# Patient Record
Sex: Female | Born: 1945 | Race: Black or African American | Hispanic: No | Smoking: Former smoker
Health system: Southern US, Community
[De-identification: ages and names within clinical notes are randomized; demographics above are authoritative.]

## PROBLEM LIST (undated history)

## (undated) DIAGNOSIS — E119 Type 2 diabetes mellitus without complications: Secondary | ICD-10-CM

## (undated) DIAGNOSIS — C3490 Malignant neoplasm of unspecified part of unspecified bronchus or lung: Secondary | ICD-10-CM

## (undated) DIAGNOSIS — J449 Chronic obstructive pulmonary disease, unspecified: Secondary | ICD-10-CM

## (undated) HISTORY — DX: Malignant neoplasm of unspecified part of unspecified bronchus or lung: C34.90

## (undated) HISTORY — PX: PORTACATH PLACEMENT: SHX2246

---

## 2004-07-08 ENCOUNTER — Ambulatory Visit: Payer: Self-pay

## 2005-10-25 ENCOUNTER — Emergency Department: Payer: Self-pay | Admitting: Emergency Medicine

## 2005-10-25 ENCOUNTER — Other Ambulatory Visit: Payer: Self-pay

## 2005-11-17 ENCOUNTER — Ambulatory Visit: Payer: Self-pay | Admitting: Cardiovascular Disease

## 2005-11-20 ENCOUNTER — Ambulatory Visit: Payer: Self-pay | Admitting: Oncology

## 2005-11-20 LAB — COMPREHENSIVE METABOLIC PANEL
ALT: 22 U/L (ref 0–40)
Albumin: 3.6 g/dL (ref 3.5–5.2)
CO2: 37 mEq/L — ABNORMAL HIGH (ref 19–32)
Potassium: 4.1 mEq/L (ref 3.5–5.3)
Sodium: 144 mEq/L (ref 135–145)
Total Bilirubin: 0.9 mg/dL (ref 0.3–1.2)
Total Protein: 6.6 g/dL (ref 6.0–8.3)

## 2005-11-20 LAB — CBC WITH DIFFERENTIAL (CANCER CENTER ONLY)
BASO#: 0 10*3/uL (ref 0.0–0.2)
Eosinophils Absolute: 0.1 10*3/uL (ref 0.0–0.5)
LYMPH%: 27.1 % (ref 14.0–48.0)
MCH: 27.4 pg (ref 26.0–34.0)
MCV: 86 fL (ref 81–101)
MONO%: 6.2 % (ref 0.0–13.0)
NEUT%: 63.3 % (ref 39.6–80.0)
Platelets: 126 10*3/uL — ABNORMAL LOW (ref 145–400)
RBC: 4.73 10*6/uL (ref 3.70–5.32)

## 2005-11-20 LAB — LACTATE DEHYDROGENASE: LDH: 206 U/L (ref 94–250)

## 2005-11-20 LAB — CEA: CEA: 2.9 ng/mL (ref 0.0–5.0)

## 2005-11-27 ENCOUNTER — Ambulatory Visit (HOSPITAL_COMMUNITY): Admission: RE | Admit: 2005-11-27 | Discharge: 2005-11-27 | Payer: Self-pay | Admitting: Oncology

## 2005-12-11 LAB — CBC WITH DIFFERENTIAL (CANCER CENTER ONLY)
BASO#: 0 10*3/uL (ref 0.0–0.2)
Eosinophils Absolute: 0.1 10*3/uL (ref 0.0–0.5)
HGB: 13.2 g/dL (ref 11.6–15.9)
MCH: 28.3 pg (ref 26.0–34.0)
MCV: 87 fL (ref 81–101)
MONO%: 8.6 % (ref 0.0–13.0)
NEUT#: 2.4 10*3/uL (ref 1.5–6.5)
RBC: 4.66 10*6/uL (ref 3.70–5.32)

## 2005-12-24 ENCOUNTER — Ambulatory Visit: Payer: Self-pay | Admitting: Internal Medicine

## 2005-12-25 ENCOUNTER — Ambulatory Visit: Payer: Self-pay | Admitting: Oncology

## 2006-01-15 ENCOUNTER — Ambulatory Visit: Payer: Self-pay | Admitting: Gastroenterology

## 2006-01-21 ENCOUNTER — Ambulatory Visit: Payer: Self-pay | Admitting: Internal Medicine

## 2006-02-16 ENCOUNTER — Ambulatory Visit: Payer: Self-pay | Admitting: Gastroenterology

## 2006-03-11 ENCOUNTER — Ambulatory Visit: Payer: Self-pay | Admitting: Gastroenterology

## 2006-04-08 ENCOUNTER — Ambulatory Visit: Payer: Self-pay | Admitting: Oncology

## 2006-04-09 LAB — CBC WITH DIFFERENTIAL (CANCER CENTER ONLY)
BASO%: 0.4 % (ref 0.0–2.0)
EOS%: 2.2 % (ref 0.0–7.0)
LYMPH%: 27.9 % (ref 14.0–48.0)
MCH: 28.3 pg (ref 26.0–34.0)
MCHC: 32.4 g/dL (ref 32.0–36.0)
MCV: 87 fL (ref 81–101)
MONO%: 8.1 % (ref 0.0–13.0)
Platelets: 159 10*3/uL (ref 145–400)
RDW: 12.4 % (ref 10.5–14.6)
WBC: 4.2 10*3/uL (ref 3.9–10.0)

## 2006-04-09 LAB — BASIC METABOLIC PANEL
CO2: 32 mEq/L (ref 19–32)
Calcium: 9.4 mg/dL (ref 8.4–10.5)
Creatinine, Ser: 1.03 mg/dL (ref 0.40–1.20)
Glucose, Bld: 127 mg/dL — ABNORMAL HIGH (ref 70–99)

## 2006-06-11 ENCOUNTER — Ambulatory Visit: Payer: Self-pay | Admitting: Gastroenterology

## 2006-12-24 ENCOUNTER — Ambulatory Visit: Payer: Self-pay | Admitting: Gastroenterology

## 2006-12-29 ENCOUNTER — Ambulatory Visit: Payer: Self-pay | Admitting: Family Medicine

## 2007-03-29 ENCOUNTER — Ambulatory Visit: Payer: Self-pay | Admitting: Oncology

## 2007-04-06 LAB — CBC WITH DIFFERENTIAL (CANCER CENTER ONLY)
BASO#: 0 10*3/uL (ref 0.0–0.2)
EOS%: 2.6 % (ref 0.0–7.0)
Eosinophils Absolute: 0.1 10*3/uL (ref 0.0–0.5)
HGB: 11.8 g/dL (ref 11.6–15.9)
LYMPH#: 1 10*3/uL (ref 0.9–3.3)
MONO#: 0.3 10*3/uL (ref 0.1–0.9)
NEUT#: 2.3 10*3/uL (ref 1.5–6.5)
RBC: 4.26 10*6/uL (ref 3.70–5.32)
WBC: 3.8 10*3/uL — ABNORMAL LOW (ref 3.9–10.0)

## 2007-04-06 LAB — COMPREHENSIVE METABOLIC PANEL
Albumin: 4.1 g/dL (ref 3.5–5.2)
Alkaline Phosphatase: 74 U/L (ref 39–117)
BUN: 17 mg/dL (ref 6–23)
Creatinine, Ser: 0.91 mg/dL (ref 0.40–1.20)
Glucose, Bld: 153 mg/dL — ABNORMAL HIGH (ref 70–99)
Potassium: 4.1 mEq/L (ref 3.5–5.3)
Total Bilirubin: 0.6 mg/dL (ref 0.3–1.2)

## 2007-05-02 ENCOUNTER — Emergency Department: Payer: Self-pay | Admitting: Emergency Medicine

## 2007-05-02 ENCOUNTER — Other Ambulatory Visit: Payer: Self-pay

## 2007-07-01 ENCOUNTER — Ambulatory Visit: Payer: Self-pay | Admitting: Gastroenterology

## 2007-08-05 ENCOUNTER — Ambulatory Visit: Payer: Self-pay | Admitting: Gastroenterology

## 2007-10-11 ENCOUNTER — Ambulatory Visit: Payer: Self-pay | Admitting: Oncology

## 2007-10-13 LAB — CBC WITH DIFFERENTIAL (CANCER CENTER ONLY)
BASO%: 0.3 % (ref 0.0–2.0)
EOS%: 2.4 % (ref 0.0–7.0)
HGB: 11.8 g/dL (ref 11.6–15.9)
LYMPH#: 1 10*3/uL (ref 0.9–3.3)
MCHC: 32.8 g/dL (ref 32.0–36.0)
NEUT#: 2.3 10*3/uL (ref 1.5–6.5)
RDW: 12.4 % (ref 10.5–14.6)

## 2007-10-13 LAB — COMPREHENSIVE METABOLIC PANEL
ALT: 18 U/L (ref 0–35)
AST: 17 U/L (ref 0–37)
Albumin: 4 g/dL (ref 3.5–5.2)
Alkaline Phosphatase: 81 U/L (ref 39–117)
Potassium: 4 mEq/L (ref 3.5–5.3)
Sodium: 142 mEq/L (ref 135–145)
Total Bilirubin: 0.6 mg/dL (ref 0.3–1.2)
Total Protein: 7 g/dL (ref 6.0–8.3)

## 2008-07-31 ENCOUNTER — Emergency Department: Payer: Self-pay | Admitting: Unknown Physician Specialty

## 2008-08-29 ENCOUNTER — Ambulatory Visit: Payer: Self-pay | Admitting: Internal Medicine

## 2009-01-17 ENCOUNTER — Ambulatory Visit: Payer: Self-pay | Admitting: Family Medicine

## 2009-07-28 ENCOUNTER — Emergency Department: Payer: Self-pay | Admitting: Emergency Medicine

## 2009-08-14 ENCOUNTER — Ambulatory Visit: Payer: Self-pay | Admitting: Specialist

## 2010-04-03 ENCOUNTER — Ambulatory Visit: Payer: Self-pay | Admitting: Internal Medicine

## 2011-07-08 ENCOUNTER — Ambulatory Visit: Payer: Self-pay | Admitting: Internal Medicine

## 2012-06-15 ENCOUNTER — Emergency Department: Payer: Self-pay | Admitting: Emergency Medicine

## 2012-06-15 LAB — CBC WITH DIFFERENTIAL/PLATELET
Basophil %: 0.8 %
HCT: 36.7 % (ref 35.0–47.0)
MCHC: 32.5 g/dL (ref 32.0–36.0)
MCV: 84 fL (ref 80–100)
Monocyte #: 0.2 x10 3/mm (ref 0.2–0.9)
Monocyte %: 3.4 %
Platelet: 182 10*3/uL (ref 150–440)
WBC: 6.2 10*3/uL (ref 3.6–11.0)

## 2012-06-15 LAB — TROPONIN I: Troponin-I: <0.02 ng/mL

## 2012-06-15 LAB — BASIC METABOLIC PANEL
BUN: 34 mg/dL — ABNORMAL HIGH (ref 7–18)
Calcium, Total: 9.8 mg/dL (ref 8.5–10.1)
Chloride: 103 mmol/L (ref 98–107)
Co2: 34 mmol/L — ABNORMAL HIGH (ref 21–32)
EGFR (African American): 42 — ABNORMAL LOW
Osmolality: 296 (ref 275–301)
Sodium: 141 mmol/L (ref 136–145)

## 2012-06-15 LAB — URINALYSIS, COMPLETE
Bacteria: NONE SEEN
Blood: NEGATIVE
Glucose,UR: NEGATIVE mg/dL (ref 0–75)
Ketone: NEGATIVE
Ph: 5 (ref 4.5–8.0)
Protein: NEGATIVE

## 2012-06-15 LAB — CK TOTAL AND CKMB (NOT AT ARMC): CK-MB: 1.6 ng/mL (ref 0.5–3.6)

## 2012-08-18 ENCOUNTER — Ambulatory Visit: Payer: Self-pay | Admitting: Internal Medicine

## 2013-01-19 ENCOUNTER — Ambulatory Visit: Payer: Self-pay | Admitting: Ophthalmology

## 2013-01-31 ENCOUNTER — Ambulatory Visit: Payer: Self-pay | Admitting: Ophthalmology

## 2013-03-31 ENCOUNTER — Ambulatory Visit: Payer: Self-pay | Admitting: Ophthalmology

## 2013-03-31 LAB — POTASSIUM: POTASSIUM: 4.3 mmol/L (ref 3.5–5.1)

## 2013-04-12 ENCOUNTER — Ambulatory Visit: Payer: Self-pay | Admitting: Ophthalmology

## 2013-09-23 DIAGNOSIS — G4733 Obstructive sleep apnea (adult) (pediatric): Secondary | ICD-10-CM | POA: Insufficient documentation

## 2013-09-23 DIAGNOSIS — J449 Chronic obstructive pulmonary disease, unspecified: Secondary | ICD-10-CM | POA: Insufficient documentation

## 2013-10-21 ENCOUNTER — Ambulatory Visit: Payer: Self-pay | Admitting: Internal Medicine

## 2013-10-26 ENCOUNTER — Ambulatory Visit: Payer: Self-pay | Admitting: Internal Medicine

## 2013-12-23 ENCOUNTER — Ambulatory Visit: Payer: Self-pay | Admitting: Internal Medicine

## 2014-01-02 ENCOUNTER — Ambulatory Visit: Payer: Self-pay | Admitting: Internal Medicine

## 2014-01-06 ENCOUNTER — Ambulatory Visit: Payer: Self-pay | Admitting: Internal Medicine

## 2014-01-09 LAB — PROTEIN ELECTROPHORESIS(ARMC)

## 2014-01-09 LAB — UR PROT ELECTROPHORESIS, URINE RANDOM

## 2014-01-13 ENCOUNTER — Ambulatory Visit: Payer: Self-pay | Admitting: Internal Medicine

## 2014-01-13 LAB — CBC CANCER CENTER
Basophil #: 0 "x10 3/mm "
Basophil %: 0.4 %
Eosinophil #: 0.1 "x10 3/mm "
Eosinophil %: 1.6 %
HCT: 36.6 %
HGB: 11.6 g/dL — ABNORMAL LOW
Lymphocyte %: 16.2 %
Lymphs Abs: 0.8 "x10 3/mm " — ABNORMAL LOW
MCH: 27.3 pg
MCHC: 31.7 g/dL — ABNORMAL LOW
MCV: 86 fL
Monocyte #: 0.4 "x10 3/mm "
Monocyte %: 9.6 %
Neutrophil #: 3.4 "x10 3/mm "
Neutrophil %: 72.2 %
Platelet: 203 "x10 3/mm "
RBC: 4.26 "x10 6/mm "
RDW: 14.1 %
WBC: 4.7 "x10 3/mm "

## 2014-01-13 LAB — COMPREHENSIVE METABOLIC PANEL
ALBUMIN: 3.8 g/dL (ref 3.4–5.0)
AST: 28 U/L (ref 15–37)
Alkaline Phosphatase: 124 U/L — ABNORMAL HIGH
Anion Gap: 8 (ref 7–16)
BUN: 28 mg/dL — AB (ref 7–18)
Bilirubin,Total: 0.7 mg/dL (ref 0.2–1.0)
CALCIUM: 9.8 mg/dL (ref 8.5–10.1)
CHLORIDE: 101 mmol/L (ref 98–107)
CO2: 36 mmol/L — AB (ref 21–32)
CREATININE: 1.59 mg/dL — AB (ref 0.60–1.30)
EGFR (African American): 42 — ABNORMAL LOW
GFR CALC NON AF AMER: 34 — AB
Glucose: 128 mg/dL — ABNORMAL HIGH (ref 65–99)
Osmolality: 296 (ref 275–301)
Potassium: 4.3 mmol/L (ref 3.5–5.1)
SGPT (ALT): 28 U/L
SODIUM: 145 mmol/L (ref 136–145)
Total Protein: 7.9 g/dL (ref 6.4–8.2)

## 2014-01-13 LAB — PROTIME-INR
INR: 1.1
Prothrombin Time: 13.6 secs (ref 11.5–14.7)

## 2014-01-13 LAB — APTT: ACTIVATED PTT: 28.9 s (ref 23.6–35.9)

## 2014-01-16 ENCOUNTER — Ambulatory Visit: Payer: Self-pay | Admitting: Gastroenterology

## 2014-01-31 ENCOUNTER — Ambulatory Visit: Payer: Self-pay | Admitting: Internal Medicine

## 2014-02-06 ENCOUNTER — Ambulatory Visit: Payer: Self-pay | Admitting: Internal Medicine

## 2014-02-10 ENCOUNTER — Ambulatory Visit: Payer: Self-pay | Admitting: Internal Medicine

## 2014-02-17 LAB — CREATININE, SERUM
Creatinine: 1.41 mg/dL — ABNORMAL HIGH (ref 0.60–1.30)
EGFR (Non-African Amer.): 39 — ABNORMAL LOW
GFR CALC AF AMER: 48 — AB

## 2014-02-17 LAB — CALCIUM: Calcium, Total: 9.1 mg/dL (ref 8.5–10.1)

## 2014-02-26 ENCOUNTER — Emergency Department: Payer: Self-pay | Admitting: Emergency Medicine

## 2014-02-26 LAB — CBC WITH DIFFERENTIAL/PLATELET
Basophil #: 0 10*3/uL (ref 0.0–0.1)
Basophil %: 0.2 %
Eosinophil #: 0.1 10*3/uL (ref 0.0–0.7)
Eosinophil %: 2.2 %
HCT: 33.3 % — AB (ref 35.0–47.0)
HGB: 10.7 g/dL — ABNORMAL LOW (ref 12.0–16.0)
LYMPHS ABS: 0.2 10*3/uL — AB (ref 1.0–3.6)
Lymphocyte %: 4 %
MCH: 28.2 pg (ref 26.0–34.0)
MCHC: 32.1 g/dL (ref 32.0–36.0)
MCV: 88 fL (ref 80–100)
Monocyte #: 0.3 x10 3/mm (ref 0.2–0.9)
Monocyte %: 6.4 %
NEUTROS ABS: 4.2 10*3/uL (ref 1.4–6.5)
Neutrophil %: 87.2 %
PLATELETS: 133 10*3/uL — AB (ref 150–440)
RBC: 3.8 10*6/uL (ref 3.80–5.20)
RDW: 14.2 % (ref 11.5–14.5)
WBC: 4.9 10*3/uL (ref 3.6–11.0)

## 2014-02-26 LAB — COMPREHENSIVE METABOLIC PANEL
ALK PHOS: 94 U/L
ALT: 23 U/L
AST: 29 U/L (ref 15–37)
Albumin: 3.5 g/dL (ref 3.4–5.0)
Anion Gap: 7 (ref 7–16)
BUN: 20 mg/dL — AB (ref 7–18)
Bilirubin,Total: 0.4 mg/dL (ref 0.2–1.0)
CHLORIDE: 109 mmol/L — AB (ref 98–107)
CO2: 28 mmol/L (ref 21–32)
Calcium, Total: 7.3 mg/dL — ABNORMAL LOW (ref 8.5–10.1)
Creatinine: 0.92 mg/dL (ref 0.60–1.30)
Glucose: 133 mg/dL — ABNORMAL HIGH (ref 65–99)
Osmolality: 291 (ref 275–301)
Potassium: 4.9 mmol/L (ref 3.5–5.1)
Sodium: 144 mmol/L (ref 136–145)
Total Protein: 7.5 g/dL (ref 6.4–8.2)

## 2014-02-26 LAB — TROPONIN I

## 2014-02-26 LAB — LIPASE, BLOOD: LIPASE: 303 U/L (ref 73–393)

## 2014-03-13 ENCOUNTER — Ambulatory Visit: Payer: Self-pay | Admitting: Internal Medicine

## 2014-03-13 LAB — BASIC METABOLIC PANEL
Anion Gap: 8 (ref 7–16)
BUN: 20 mg/dL — AB (ref 7–18)
CHLORIDE: 108 mmol/L — AB (ref 98–107)
CREATININE: 1.08 mg/dL (ref 0.60–1.30)
Calcium, Total: 7.8 mg/dL — ABNORMAL LOW (ref 8.5–10.1)
Co2: 30 mmol/L (ref 21–32)
EGFR (African American): >60
GFR CALC NON AF AMER: 54 — AB
GLUCOSE: 171 mg/dL — AB (ref 65–99)
OSMOLALITY: 297 (ref 275–301)
POTASSIUM: 4.7 mmol/L (ref 3.5–5.1)
SODIUM: 146 mmol/L — AB (ref 136–145)

## 2014-03-13 LAB — HEPATIC FUNCTION PANEL A (ARMC)
ALBUMIN: 3.2 g/dL — AB (ref 3.4–5.0)
ALK PHOS: 79 U/L (ref 46–116)
ALT: 21 U/L (ref 14–63)
AST: 18 U/L (ref 15–37)
Bilirubin, Direct: 0.1 mg/dL (ref 0.0–0.2)
Bilirubin,Total: 0.5 mg/dL (ref 0.2–1.0)
TOTAL PROTEIN: 6.7 g/dL (ref 6.4–8.2)

## 2014-03-13 LAB — CBC CANCER CENTER
Basophil #: 0 x10 3/mm (ref 0.0–0.1)
Basophil %: 0.3 %
EOS PCT: 4.3 %
Eosinophil #: 0.2 x10 3/mm (ref 0.0–0.7)
HCT: 30.2 % — ABNORMAL LOW (ref 35.0–47.0)
HGB: 9.7 g/dL — ABNORMAL LOW (ref 12.0–16.0)
LYMPHS ABS: 0.1 x10 3/mm — AB (ref 1.0–3.6)
Lymphocyte %: 3.4 %
MCH: 27.6 pg (ref 26.0–34.0)
MCHC: 32 g/dL (ref 32.0–36.0)
MCV: 86 fL (ref 80–100)
MONOS PCT: 9 %
Monocyte #: 0.4 x10 3/mm (ref 0.2–0.9)
NEUTROS ABS: 3.5 x10 3/mm (ref 1.4–6.5)
Neutrophil %: 83 %
Platelet: 141 x10 3/mm — ABNORMAL LOW (ref 150–440)
RBC: 3.5 10*6/uL — ABNORMAL LOW (ref 3.80–5.20)
RDW: 14.5 % (ref 11.5–14.5)
WBC: 4.2 x10 3/mm (ref 3.6–11.0)

## 2014-03-17 LAB — BASIC METABOLIC PANEL
ANION GAP: 8 (ref 7–16)
BUN: 20 mg/dL — ABNORMAL HIGH (ref 7–18)
CALCIUM: 7.1 mg/dL — AB (ref 8.5–10.1)
Chloride: 107 mmol/L (ref 98–107)
Co2: 30 mmol/L (ref 21–32)
Creatinine: 1.05 mg/dL (ref 0.60–1.30)
EGFR (Non-African Amer.): 55 — ABNORMAL LOW
GLUCOSE: 42 mg/dL — AB (ref 65–99)
OSMOLALITY: 288 (ref 275–301)
Potassium: 5.2 mmol/L — ABNORMAL HIGH (ref 3.5–5.1)
Sodium: 145 mmol/L (ref 136–145)

## 2014-03-17 LAB — URINALYSIS, COMPLETE
BACTERIA: NONE SEEN
Bilirubin,UR: NEGATIVE
GLUCOSE, UR: NEGATIVE mg/dL (ref 0–75)
KETONE: NEGATIVE
Nitrite: NEGATIVE
Ph: 7 (ref 4.5–8.0)
Protein: NEGATIVE
SPECIFIC GRAVITY: 1.012 (ref 1.003–1.030)

## 2014-03-17 LAB — CBC CANCER CENTER
BASOS PCT: 0.2 %
Basophil #: 0 x10 3/mm (ref 0.0–0.1)
EOS PCT: 5.3 %
Eosinophil #: 0.2 x10 3/mm (ref 0.0–0.7)
HCT: 30.2 % — AB (ref 35.0–47.0)
HGB: 9.6 g/dL — AB (ref 12.0–16.0)
Lymphocyte #: 0.2 x10 3/mm — ABNORMAL LOW (ref 1.0–3.6)
Lymphocyte %: 4.5 %
MCH: 27.4 pg (ref 26.0–34.0)
MCHC: 31.9 g/dL — ABNORMAL LOW (ref 32.0–36.0)
MCV: 86 fL (ref 80–100)
MONO ABS: 0 x10 3/mm — AB (ref 0.2–0.9)
Monocyte %: 1.5 %
NEUTROS ABS: 3 x10 3/mm (ref 1.4–6.5)
NEUTROS PCT: 88.5 %
Platelet: 146 x10 3/mm — ABNORMAL LOW (ref 150–440)
RBC: 3.51 10*6/uL — AB (ref 3.80–5.20)
RDW: 14.3 % (ref 11.5–14.5)
WBC: 3.4 x10 3/mm — ABNORMAL LOW (ref 3.6–11.0)

## 2014-03-17 LAB — GLUCOSE, RANDOM: GLUCOSE: 122 mg/dL — AB (ref 65–99)

## 2014-03-19 LAB — URINE CULTURE

## 2014-04-04 ENCOUNTER — Ambulatory Visit: Payer: Self-pay | Admitting: Vascular Surgery

## 2014-04-11 ENCOUNTER — Ambulatory Visit
Admit: 2014-04-11 | Disposition: A | Discharge status: Home / Self Care | Payer: Self-pay | Attending: Internal Medicine | Admitting: Internal Medicine

## 2014-05-04 LAB — POTASSIUM: Potassium: 4.3 mmol/L

## 2014-05-05 LAB — POTASSIUM: POTASSIUM: 6.1 mmol/L — AB

## 2014-05-11 LAB — CBC CANCER CENTER
BASOS ABS: 0 x10 3/mm (ref 0.0–0.1)
BASOS PCT: 0.2 %
EOS ABS: 0 x10 3/mm (ref 0.0–0.7)
Eosinophil %: 0.8 %
HCT: 20.6 % — ABNORMAL LOW (ref 35.0–47.0)
HGB: 6.3 g/dL — AB (ref 12.0–16.0)
LYMPHS PCT: 4.5 %
Lymphocyte #: 0.2 x10 3/mm — ABNORMAL LOW (ref 1.0–3.6)
MCH: 27.4 pg (ref 26.0–34.0)
MCHC: 30.4 g/dL — AB (ref 32.0–36.0)
MCV: 90 fL (ref 80–100)
MONO ABS: 0.6 x10 3/mm (ref 0.2–0.9)
Monocyte %: 15.7 %
NEUTROS PCT: 78.8 %
Neutrophil #: 3 x10 3/mm (ref 1.4–6.5)
Platelet: 239 x10 3/mm (ref 150–440)
RBC: 2.29 10*6/uL — ABNORMAL LOW (ref 3.80–5.20)
RDW: 18.1 % — AB (ref 11.5–14.5)
WBC: 3.8 x10 3/mm (ref 3.6–11.0)

## 2014-05-11 LAB — HEPATIC FUNCTION PANEL A (ARMC)
ALBUMIN: 2.9 g/dL — AB
ALK PHOS: 58 U/L
ALT: 10 U/L — AB
BILIRUBIN TOTAL: 0.6 mg/dL
Bilirubin, Direct: <0.1 mg/dL
SGOT(AST): 21 U/L
TOTAL PROTEIN: 6.6 g/dL

## 2014-05-11 LAB — BASIC METABOLIC PANEL
ANION GAP: 6 — AB (ref 7–16)
BUN: 35 mg/dL — AB
CHLORIDE: 105 mmol/L
CO2: 28 mmol/L
Calcium, Total: 7.5 mg/dL — ABNORMAL LOW
Creatinine: 1.32 mg/dL — ABNORMAL HIGH
GFR CALC AF AMER: 48 — AB
GFR CALC NON AF AMER: 41 — AB
Glucose: 119 mg/dL — ABNORMAL HIGH
Potassium: 4.3 mmol/L
Sodium: 139 mmol/L

## 2014-05-12 ENCOUNTER — Ambulatory Visit
Admit: 2014-05-12 | Disposition: A | Discharge status: Home / Self Care | Payer: Self-pay | Attending: Internal Medicine | Admitting: Internal Medicine

## 2014-05-16 LAB — CBC CANCER CENTER
BASOS PCT: 0.3 %
Basophil #: 0 x10 3/mm (ref 0.0–0.1)
Eosinophil #: 0 x10 3/mm (ref 0.0–0.7)
Eosinophil %: 0.6 %
HCT: 25.1 % — ABNORMAL LOW (ref 35.0–47.0)
HGB: 8.2 g/dL — AB (ref 12.0–16.0)
LYMPHS PCT: 5.5 %
Lymphocyte #: 0.2 x10 3/mm — ABNORMAL LOW (ref 1.0–3.6)
MCH: 28.6 pg (ref 26.0–34.0)
MCHC: 32.8 g/dL (ref 32.0–36.0)
MCV: 87 fL (ref 80–100)
MONOS PCT: 15.4 %
Monocyte #: 0.6 x10 3/mm (ref 0.2–0.9)
NEUTROS PCT: 78.2 %
Neutrophil #: 3.3 x10 3/mm (ref 1.4–6.5)
PLATELETS: 224 x10 3/mm (ref 150–440)
RBC: 2.88 10*6/uL — AB (ref 3.80–5.20)
RDW: 18.8 % — ABNORMAL HIGH (ref 11.5–14.5)
WBC: 4.2 x10 3/mm (ref 3.6–11.0)

## 2014-05-16 LAB — BASIC METABOLIC PANEL
Anion Gap: 5 — ABNORMAL LOW (ref 7–16)
BUN: 16 mg/dL
CALCIUM: 8 mg/dL — AB
CHLORIDE: 106 mmol/L
CO2: 30 mmol/L
Creatinine: 0.85 mg/dL
EGFR (African American): >60
EGFR (Non-African Amer.): >60
Glucose: 123 mg/dL — ABNORMAL HIGH
Potassium: 4.4 mmol/L
SODIUM: 141 mmol/L

## 2014-05-23 LAB — CBC CANCER CENTER
BASOS PCT: 0.2 %
Basophil #: 0 x10 3/mm (ref 0.0–0.1)
EOS PCT: 0.5 %
Eosinophil #: 0 x10 3/mm (ref 0.0–0.7)
HCT: 22.8 % — ABNORMAL LOW (ref 35.0–47.0)
HGB: 7.3 g/dL — ABNORMAL LOW (ref 12.0–16.0)
Lymphocyte #: 0.1 x10 3/mm — ABNORMAL LOW (ref 1.0–3.6)
Lymphocyte %: 2.9 %
MCH: 27.6 pg (ref 26.0–34.0)
MCHC: 31.9 g/dL — ABNORMAL LOW (ref 32.0–36.0)
MCV: 87 fL (ref 80–100)
MONO ABS: 0.4 x10 3/mm (ref 0.2–0.9)
Monocyte %: 17.5 %
NEUTROS PCT: 78.9 %
Neutrophil #: 1.8 x10 3/mm (ref 1.4–6.5)
Platelet: 79 x10 3/mm — ABNORMAL LOW (ref 150–440)
RBC: 2.63 10*6/uL — AB (ref 3.80–5.20)
RDW: 17.9 % — ABNORMAL HIGH (ref 11.5–14.5)
WBC: 2.3 x10 3/mm — ABNORMAL LOW (ref 3.6–11.0)

## 2014-05-23 LAB — POTASSIUM: POTASSIUM: 4.7 mmol/L

## 2014-05-30 LAB — CBC CANCER CENTER
BASOS ABS: 0 x10 3/mm (ref 0.0–0.1)
Basophil %: 0.1 %
Eosinophil #: 0 x10 3/mm (ref 0.0–0.7)
Eosinophil %: 0.5 %
HCT: 19.5 % — ABNORMAL LOW (ref 35.0–47.0)
HGB: 6.1 g/dL — ABNORMAL LOW (ref 12.0–16.0)
LYMPHS PCT: 5.2 %
Lymphocyte #: 0.3 x10 3/mm — ABNORMAL LOW (ref 1.0–3.6)
MCH: 27.6 pg (ref 26.0–34.0)
MCHC: 31.3 g/dL — ABNORMAL LOW (ref 32.0–36.0)
MCV: 88 fL (ref 80–100)
MONOS PCT: 12.5 %
Monocyte #: 0.7 x10 3/mm (ref 0.2–0.9)
Neutrophil #: 4.6 x10 3/mm (ref 1.4–6.5)
Neutrophil %: 81.7 %
PLATELETS: 187 x10 3/mm (ref 150–440)
RBC: 2.21 10*6/uL — ABNORMAL LOW (ref 3.80–5.20)
RDW: 18.6 % — AB (ref 11.5–14.5)
WBC: 5.6 x10 3/mm (ref 3.6–11.0)

## 2014-05-30 LAB — POTASSIUM: Potassium: 4 mmol/L

## 2014-05-30 LAB — CREATININE, SERUM
Creatinine: 1.04 mg/dL — ABNORMAL HIGH
EGFR (African American): >60
EGFR (Non-African Amer.): 55 — ABNORMAL LOW

## 2014-05-30 LAB — HEPATIC FUNCTION PANEL A (ARMC)
ALK PHOS: 64 U/L
Albumin: 2.3 g/dL — ABNORMAL LOW
BILIRUBIN TOTAL: 0.5 mg/dL
SGOT(AST): 30 U/L
SGPT (ALT): 18 U/L
Total Protein: 6 g/dL — ABNORMAL LOW

## 2014-06-03 NOTE — Op Note (Signed)
PATIENT NAME:  Sharon Duran, Sharon Duran MR#:  [id number] DATE OF BIRTH:  [birth year]  DATE OF PROCEDURE:  04/12/2013  PREOPERATIVE DIAGNOSIS: Visually significant cataract of the left eye.   POSTOPERATIVE DIAGNOSIS: Visually significant cataract of the left eye.   OPERATIVE PROCEDURE: Cataract extraction by phacoemulsification with implant of intraocular lens to left eye.   SURGEON: Birder Robson, MD.   ANESTHESIA:  1. Managed anesthesia care.  2. Topical tetracaine drops followed by 2% Xylocaine jelly applied in the preoperative holding area.   COMPLICATIONS: None.   TECHNIQUE:  Stop and chop.  DESCRIPTION OF PROCEDURE: The patient was examined and consented in the preoperative holding area where the aforementioned topical anesthesia was applied to the left eye and then brought back to the Operating Room where the left eye was prepped and draped in the usual sterile ophthalmic fashion and a lid speculum was placed. A paracentesis was created with the side port blade and the anterior chamber was filled with viscoelastic. A near clear corneal incision was performed with the steel keratome. A continuous curvilinear capsulorrhexis was performed with a cystotome followed by the capsulorrhexis forceps. Hydrodissection and hydrodelineation were carried out with BSS on a blunt cannula. The lens was removed in a stop and chop technique and the remaining cortical material was removed with the irrigation-aspiration handpiece. The capsular bag was inflated with viscoelastic and the Tecnis ZCB00 24.5-diopter lens, serial number [id number] was placed in the capsular bag without complication. The remaining viscoelastic was removed from the eye with the irrigation-aspiration handpiece. The wounds were hydrated. The anterior chamber was flushed with Miostat and the eye was inflated to physiologic pressure. 0.1 mL of cefuroxime concentration 10 mg/mL was placed in the anterior chamber. The wounds were found to be water  tight. The eye was dressed with Vigamox. The patient was given protective glasses to wear throughout the day and a shield with which to sleep tonight. The patient was also given drops with which to begin a drop regimen today and will follow-up with me in one day.    ____________________________ Livingston Diones. Alani Lacivita, MD wlp:ea D: 04/12/2013 20:03:33 ET T: 04/13/2013 07:18:27 ET JOB#: [id number]  cc: Babe Anthis L. Argie Lober, MD, <Dictator> Livingston Diones Alyxis Grippi MD ELECTRONICALLY SIGNED 04/14/2013 9:39

## 2014-06-03 NOTE — Op Note (Signed)
PATIENT NAME:  Sharon Duran, Sharon Duran MR#:  [id number] DATE OF BIRTH:  02-09-1946  DATE OF PROCEDURE:  01/31/2013  PREOPERATIVE DIAGNOSIS: Visually significant cataract of the right eye.   POSTOPERATIVE DIAGNOSIS: Visually significant cataract of the right eye.   OPERATIVE PROCEDURE: Cataract extraction by phacoemulsification with implant of intraocular lens to the right eye.   SURGEON: Birder Robson, MD.   ANESTHESIA:  1. Managed anesthesia care.  2. Topical tetracaine drops followed by 2% Xylocaine jelly applied in the preoperative holding area.   COMPLICATIONS: None.   TECHNIQUE:  Stop and chop.   DESCRIPTION OF PROCEDURE: The patient was examined and consented in the preoperative holding area where the aforementioned topical anesthesia was applied to the right eye and then brought back to the Operating Room where the right eye was prepped and draped in the usual sterile ophthalmic fashion and a lid speculum was placed. A paracentesis was created with the side port blade and the anterior chamber was filled with viscoelastic. A near clear corneal incision was performed with the steel keratome. A continuous curvilinear capsulorrhexis was performed with a cystotome followed by the capsulorrhexis forceps. Hydrodissection and hydrodelineation were carried out with BSS on a blunt cannula. The lens was removed in a stop and chop technique and the remaining cortical material was removed with the irrigation-aspiration handpiece. The capsular bag was inflated with viscoelastic and the Tecnis ZCB00 23.5-diopter lens, serial number [id number] was placed in the capsular bag without complication. The remaining viscoelastic was removed from the eye with the irrigation-aspiration handpiece. The wounds were hydrated. The anterior chamber was flushed with Miostat and the eye was inflated to physiologic pressure. 0.1 mL of cefuroxime concentration 10 mg/mL was placed in the anterior chamber. The wounds were found to  be water tight. The eye was dressed with Vigamox. The patient was given protective glasses to wear throughout the day and a shield with which to sleep tonight. The patient was also given drops with which to begin a drop regimen today and will follow-up with me in one day.   ____________________________ Livingston Diones. Mikyla Schachter, MD wlp:gb D: 01/31/2013 17:00:40 ET T: 01/31/2013 23:08:53 ET JOB#: [id number]  cc: Gazelle Towe L. Leyli Kevorkian, MD, <Dictator> Livingston Diones Nilsa Macht MD ELECTRONICALLY SIGNED 02/17/2013 17:18

## 2014-06-05 LAB — SURGICAL PATHOLOGY

## 2014-06-06 LAB — CBC CANCER CENTER
BASOS PCT: 0.4 %
Basophil #: 0 x10 3/mm (ref 0.0–0.1)
EOS PCT: 0.6 %
Eosinophil #: 0 x10 3/mm (ref 0.0–0.7)
HCT: 28.9 % — ABNORMAL LOW (ref 35.0–47.0)
HGB: 9.3 g/dL — AB (ref 12.0–16.0)
Lymphocyte #: 0.3 x10 3/mm — ABNORMAL LOW (ref 1.0–3.6)
Lymphocyte %: 6 %
MCH: 28.4 pg (ref 26.0–34.0)
MCHC: 32.3 g/dL (ref 32.0–36.0)
MCV: 88 fL (ref 80–100)
Monocyte #: 0.6 x10 3/mm (ref 0.2–0.9)
Monocyte %: 13 %
NEUTROS PCT: 80 %
Neutrophil #: 4 x10 3/mm (ref 1.4–6.5)
Platelet: 212 x10 3/mm (ref 150–440)
RBC: 3.29 10*6/uL — AB (ref 3.80–5.20)
RDW: 18.7 % — ABNORMAL HIGH (ref 11.5–14.5)
WBC: 5 x10 3/mm (ref 3.6–11.0)

## 2014-06-06 LAB — BASIC METABOLIC PANEL
Anion Gap: 5 — ABNORMAL LOW (ref 7–16)
BUN: 15 mg/dL
CHLORIDE: 104 mmol/L
CREATININE: 1.06 mg/dL — AB
Calcium, Total: 8.6 mg/dL — ABNORMAL LOW
Co2: 31 mmol/L
EGFR (Non-African Amer.): 54 — ABNORMAL LOW
GLUCOSE: 132 mg/dL — AB
POTASSIUM: 4.1 mmol/L
Sodium: 140 mmol/L

## 2014-06-11 NOTE — Consult Note (Signed)
Reason for Visit: This 69 year old Female patient presents to the clinic for initial evaluation of  bone metastases .   Referred by Dr Ma Hillock.  Diagnosis:  Chief Complaint/Diagnosis   69 year old female with widespread involvement of bone with metastatic adenocarcinoma from preliminary pathology report of biopsy of her iliac crest with L3 lesion as well as marked destruction of right acetabular proximal femur  Pathology Report preliminary pathology report reviewed   Imaging Report PET CT scan is reviewed   Referral Report clinical notes reviewed   Planned Treatment Regimen palliative radiation therapy to right hip and L-spine SI joints   HPI   patient is a pleasant 68 year old female who presented with increasing lower back pain and difficulty ambulating. She was noted to the hospital CT scan and bone scan were both suggestive of a primary lung cancer with widespread metastatic disease of her bones. PET CT scan was performedshowing a 2.6 nodule in the medial right upper lobe compatible with primary bronchogenic carcinoma with multifocal osseous metastasis. There was a lytic lesion at L3 with lesion involving the pedicle and transverse pot process. Core biopsies of this area were performed an initial preliminary report is positive for metastatic adenocarcinoma. Further stains are being performed for delineation as a primary lung cancer. She is seen today and is wheelchair-bound. She is having no sensory level. Ambulation is difficulty secondary to pain she does have marked destruction of the right acetabulum and right proximal femur.she is referred today for palliative radiation therapy.  Past Hx:    Sleep Apnea:    Hypothyroidism:    GERD:    copd:    Hypertension:    diabetes:    Right Ear:    Hysterectomy:   Past, Family and Social History:  Past Medical History positive   Cardiovascular hyperlipidemia; hypertension   Respiratory COPD; sleep apnea   Gastrointestinal  GERD   Genitourinary chronic renal insufficiency   Endocrine diabetes mellitus; hypothyroidism   Past Surgical History hysterectomy   Family History positive   Family History Comments family history positive for adult-onset diabetes mother with ovarian cancer and sister with lung cancer   Social History positive   Social History Comments 25-pack-year smoking history quit smoking 1991 no EtOH abuse history.   Additional Past Medical and Surgical History accompanied by multiple family members.   Allergies:   No Known Allergies:   Home Meds:  Home Medications: Medication Instructions Status  Norco 325 mg-5 mg oral tablet Take 1 - 2 tab(s) orally every 6 hours as needed for pain. Active  Duragesic-12 12 mcg/hr transdermal film, extended release 1 patch transdermal every 72 hours Active  baclofen 10 mg oral tablet 1 tab(s) orally 3 times a day Active  Victoza 18 mg/3 mL subcutaneous solution  subcutaneous  once a day Active  losartan 100 mg oral tablet 1 tab(s) orally once a day Active  montelukast 10 mg oral tablet 1 tab(s) orally once a day (in the evening) Active  amLODIPine 5 mg oral tablet 1 tab(s) orally once a day Active  atenolol 50 mg oral tablet 1 tab(s) orally once a day Active  furosemide 40 mg oral tablet 1 tab(s) orally 2 times a day Active  potassium chloride 10 mEq oral tablet, extended release 1 tab(s) orally 2 times a day Active  omeprazole 20 mg oral delayed release capsule 1 cap(s) orally 2 times a day Active  Advair Diskus 250 mcg-50 mcg inhalation powder 1 puff(s) inhaled 2 times a day Active  Crestor 10 mg oral tablet 1 tab(s) orally once a day (at bedtime) Active  Bayer Aspirin 325 mg oral delayed release tablet 1 tab(s) orally once a day Active  GlipiZIDE XL 10 mg oral tablet, extended release 1 tab(s) orally 2 times a day Active   Review of Systems:  General negative   Performance Status (ECOG) 1   Skin negative   Breast negative   Ophthalmologic  negative   ENMT negative   Respiratory and Thorax see HPI   Cardiovascular negative   Gastrointestinal negative   Genitourinary negative   Musculoskeletal negative   Neurological see HPI   Psychiatric negative   Hematology/Lymphatics negative   Endocrine negative   Allergic/Immunologic negative   Review of Systems   except for increasing difficulty to ambulate patient is constipated from her narcotics. Otherwisedenies any weight loss, fatigue, weakness, fever, chills or night sweats. Patient denies any loss of vision, blurred vision. Patient denies any ringing  of the ears or hearing loss. No irregular heartbeat. Patient denies heart murmur or history of fainting. Patient denies any chest pain or pain radiating to her upper extremities. Patient denies any shortness of breath, difficulty breathing at night, cough or hemoptysis. Patient denies any swelling in the lower legs. Patient denies any nausea vomiting, vomiting of blood, or coffee ground material in the vomitus. Patient denies any stomach pain. Patient states has had normal bowel movements no significant constipation or diarrhea. Patient denies any dysuria, hematuria or significant nocturia. Patient denies any problems walking, swelling in the joints or loss of balance. Patient denies any skin changes, loss of hair or loss of weight. Patient denies any excessive worrying or anxiety or significant depression. Patient denies any problems with insomnia. Patient denies excessive thirst, polyuria, polydipsia. Patient denies any swollen glands, patient denies easy bruising or easy bleeding. Patient denies any recent infections, allergies or URI. Patient "s visual fields have not changed significantly in recent time.   Nursing Notes:  Nursing Vital Signs and Chemo Nursing Nursing Notes: *CC Vital Signs Flowsheet:   04-Jan-16 09:28  Temp Temperature 97.4  Pulse Pulse 91  Respirations Respirations 18  SBP SBP 133  DBP DBP 78  Pain  Scale (0-10)  8  Current Weight (kg) (kg) 99.7   Physical Exam:  General/Skin/HEENT:  Skin normal   Eyes normal   ENMT normal   Head and Neck normal   Additional PE well-developed obese wheelchair-bound female in NAD. Lungs are clear to A&P cardiac examination shows regular rate and rhythm. She has increased pain on range of motion of her right lower extremity. Also pain is elicited on deep palpation of her lumbar spine. No sensorimotor level is appreciated. Proprioception is intact.   Breasts/Resp/CV/GI/GU:  Respiratory and Thorax normal   Cardiovascular normal   Gastrointestinal normal   Genitourinary normal   MS/Neuro/Psych/Lymph:  Musculoskeletal normal   Neurological normal   Lymphatics normal   Other Results:  Radiology Results: CT:    10-Dec-15 09:56, CT Chest Abdomen and Pelvis WO  CT Chest Abdomen and Pelvis WO   REASON FOR EXAM:    ELEV CREATININE ORAL ONLY  L3 Vertebral Body Lesion    Back pain  Unintentiona...  COMMENTS:       PROCEDURE: CT  - CT CHEST ABDOMEN AND PELVIS WO  - Jan 19 2014  9:56AM     CLINICAL DATA:  69 year old female with newly diagnosed lesion in  the at L3 vertebral body concerning for potential metastasis or  myeloma.  Lower back pain and left hip pain. Shortness of breath.    EXAM:  CT CHEST, ABDOMEN AND PELVIS WITHOUT CONTRAST    TECHNIQUE:  Multidetector CT imaging of the chest, abdomen and pelvis was  performed following the standard protocol without IV contrast.    COMPARISON:  MRI of the lumbar spine 12/23/2013.    FINDINGS:  CT CHEST FINDINGS    Mediastinum: Heterogeneous appearance of the thyroid gland with  masslike enlargement of the left lobe of the gland which measures up  to 4.2 cm and contains several coarse calcifications. Heart size is  normal. There is no significant pericardial fluid, thickening or  pericardial calcification. There is atherosclerosis of thethoracic  aorta, the great vessels of the  mediastinum and the coronary  arteries, including calcified atherosclerotic plaque in the left  anterior descending, left circumflex and right coronary arteries.  There are 2 low-density lesions in the middlemediastinum  immediately posterior to the esophagus measuring 12 mm in short axis  (image 22 of series 2) and 18 mm in short axis (image 28 of series  2). These are poorly characterized on today's non contrast CT  examination, and could represent enlarged low-attenuation lymph  nodes, or could be fluid collections. No other definite mediastinal  or hilar lymphadenopathy. Please note that accurate exclusion of  hilar adenopathy is limited on noncontrast CT scans. Esophagus is  unremarkable in appearance, but is intimately associated with both  of these middle mediastinal lesions.    Lungs/Pleura: In the perihilar aspect of the right upper lobe  posteriorly there is a 3.0 x 2.6 x 2.1 cm macrolobulated mass (image  22 of series 4), highly concerning for primary bronchogenic  neoplasm. This abuts the right major fissure, and is immediately  posterior to the right upper lobe bronchus. No definite direct  invasion into the right upper lobe bronchus at this time. There are  several other scattered smaller pulmonary nodules in the lungs  bilaterally (right greater than left). Some of these are small and  solid in appearance, such as a 5 mm nodule in the anterior right  upper lobe (image 23 of series 4, while others are more ill-defined  and ground-glass attenuation in appearance, including a 8 x 10 mm  nodule in the left lower lobe (image 42 of series 4). Still others  are more mixed attenuation with a ground-glass attenuation component  and central solid component, including a 12 x 17 mmlesion in the  right lower lobe (image 36 of series 4) which is predominantly  ground-glass attenuation, but has a central 8 mm solid component  (image 36 of series 2). Mild diffuse bronchial wall  thickening with  mild centrilobular and paraseptal emphysema.  Musculoskeletal: There are no aggressive appearing lytic or blastic  lesions noted in the visualized portions of the skeleton.    CT ABDOMEN AND PELVIS FINDINGS    Hepatobiliary: Small calcified granuloma in segment 2 of the liver.  No definite focal hepatic lesions identified on today's non contrast  CT examination. The unenhanced appearance of the gallbladder is  normal.    Pancreas: Unremarkable.    Spleen: Unremarkable. Small soft tissue attenuation lesions in the  left upper quadrant of the abdomen, favored to represent small  splenules.  Adrenals/Urinary Tract: Adreniform thickening of the right adrenal  gland. There is also some thickening of the left adrenal gland which  is more nodular, including the largest nodule which measures 2.6 x  1.7 cm (image 60 of series  2), but is low-attenuation (6 HU),  suggestive of a lipid poor adenoma. Several low-attenuation lesions  are noted in the kidneys bilaterally, incompletely characterized on  today's non contrast CT examination, but favored to represent cysts.  The largest of these is mildly exophytic in the lower pole of the  right kidney measuring 3.9 cm in diameter. No hydroureteronephrosis.  Urinary bladder is normal in appearance.    Stomach/Bowel: The unenhanced appearance of the stomach is normal.  No pathologic dilatation of small bowel or colon. Normal appendix.    Vascular/Lymphatic: Atherosclerosis throughout the abdominal and  pelvic vasculature, without evidence of aneurysm. No lymphadenopathy  noted in the abdomen or pelvis.    Reproductive: Status post hysterectomy.  Ovaries are atrophic.    Other: No significant volume of ascites.  No pneumoperitoneum.    Musculoskeletal: Aggressive appearing lucent lesion in the left side  of the L3 vertebral body extending posteriorly into the left pedicle  and transverse process where there are areas expansion  and  disruption of the overlying cortex, and a small amount of adjacent  soft tissue, concerning for a metastatic lesion. There appears to be  a subtle nondisplaced fracture through the superior endplate of L3,  likely pathologic. Lucent lesion with cortical disruption in the  anterior aspect of the proximal femoral diaphysis on the right  immediately beneath the lesser trochanter (image 114 of series 2)  concerning for a metastatic lesion.     IMPRESSION:  1. 3.0 x 2.6 x 2.1 cm macrolobulated mass in the perihilar aspect of  the right upper lobe is highly concerning for a primary bronchogenic  neoplasm. There are 2 lesions in the middle mediastinum, which could  represent metastatic lymphadenopathy. In addition, there are  multiple bilateral pulmonary nodules, and osseous lesions in the  proximal right femoral diaphysis and the left side of the L3  vertebral body extending into the pedicle andleft transverse  process, as well as adjacent soft tissues, highly concerning for  metastatic disease. Overall, findings are favored to reflect stage  IV lung cancer, and correlation with PET-CT and/or biopsy is  recommended for further diagnostic and staging purposes.  2. The lesions in the middle mediastinum may alternatively be fluid  filled, in which case these could represent foregut duplication cyst  or other benign lesions. Attention at time of follow-up PET imaging  is recommended.  3. Markedly enlarged heterogeneous appearing thyroid gland,  including mass-like enlargement of the left lobe of the gland. This  favored to reflect a goiter, but correlation with recent thyroid  ultrasound in conjunction with prior studies is recommended.  4. 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  5. Atherosclerosis, including 3 vessel coronary artery disease.  Please note that although the presence of  coronary artery calcium  documents the presence of coronary artery disease, the severity of  this disease and any potential stenosis cannot be assessed on this  non-gated CT examination. Assessment for potential risk factor  modification, dietary therapy or pharmacologic therapy may be  warranted, if clinically indicated.  6. Additional findings, as above.  These results will be called to the ordering clinician or  representative by the Radiologist Assistant, and communication  documented in the PACS or zVision Dashboard.      Electronically Signed    By: Vinnie Langton M.D.    On: 01/19/2014 13:10  Verified By: Etheleen Mayhew, M.D.,  Nuclear Med:    10-Dec-15 14:19, Bone Scan Whole Body (Part 2 of 2)  Bone Scan Whole Body (Part 2 of 2)   REASON FOR EXAM:    ELEV CREATININE ORAL ONLY  L3 Vertebral Body Lesion    Back pain  Unintentiona...  COMMENTS:       PROCEDURE: NM  - NM BONE WB 3 HR 2 OF 2  - Jan 19 2014  2:19PM     CLINICAL DATA:  Back pain, elevated creatinine, unintentional weight  loss    EXAM:  NUCLEAR MEDICINE WHOLE BODY BONE SCAN    TECHNIQUE:  Whole body anterior and posterior images were obtained approximately  3 hours after intravenous injection of radiopharmaceutical.  RADIOPHARMACEUTICALS:  23.01 mCi Technetium-99MDP    COMPARISON:  None    FINDINGS:  The patient was injected with 23.01 mCi of technetium 67 M MDP  intravenously, and total body bone scan was performed. There is  significant increased activity in the right intertrochanteric femur  and proximal right femur worrisome for metastatic involvement. There  is also increased activity within the left inferior sacrum, and  within the mid lower lumbar spine in the region of L3 vertebral  body. Smaller foci of activity are noted in the lower thoracic spine  in the region of T10 to the left of midline. Vague increased  activity is noted in the region of the left iliac bone  -acetabulum.  Activity in the knees is most consistent with degenerative change.  Correlate with today's CT of the chest, abdomen and pelvis, lytic  lesions are present several of the sites, consistent with lytic  metastasis.     IMPRESSION:  Foci of increased activity primarily in the region of the right  proximal femur, left inferior sacrum, lower thoracic and lower  lumbar vertebra, and left iliac bone near the superior acetabulum  consistent with metastatic disease when compared to today's CT of  the chest abdomen pelvis where lytic lesions are present at these  sites.      Electronically Signed    By: Ivar Drape M.D.    On: 01/19/2014 17:18         Verified By: Joretta Bachelor, M.D.,    22-Dec-15 14:55, PET/CT Scan Lung Cancer Diagnosis  PET/CT Scan Lung Cancer Diagnosis   REASON FOR EXAM:    lung mass bone mets  COMMENTS:       PROCEDURE: PET - PET/CT DX LUNG CA  - Jan 31 2014  2:55PM     CLINICAL DATA:  Initial treatment strategy for lung cancer with bone  metastases.    EXAM:  NUCLEAR MEDICINE PET SKULL BASE TO THIGH    TECHNIQUE:  12.7 mCi F-18 FDG was injected intravenously. Full-ring PET imaging  was performed from the skull base to thigh after the radiotracer. CT  data was obtained and used for attenuation correction and anatomic  localization.    FASTING BLOOD GLUCOSE:  Value: 115 mg/dl    COMPARISON:  Nuclear medicine bone scan dated 01/19/2014. CT chest  abdomen pelvis dated 01/19/2014.    FINDINGS:  NECK    No hypermetabolic lymph nodes in the neck.    Enlarged/heterogeneous left thyroid gland, without associated  hypermetabolism, suggesting goiter.  CHEST    2.6 x 2.6 cm macrolobulated nodule in the posteromedial right upper  lobe (series 3/image 57), max SUV 8.3, compatible with primary  bronchogenic neoplasm.    Additional 10 mm  irregular nodule in the lateral right lower lobe  (series 3/ image 90), non FDG avid, although possibly below  the size  threshold PET sensitivity.    Additional tiny bilateral pulmonary nodules (series 3/images 51, 55,  62, 75, 76, and 107).    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 No hypermetabolic thoracic lymphadenopathy.    ABDOMEN/PELVIS    No abnormal hypermetabolic activity within the liver, pancreas, or  spleen.    Low-density nodularity of the left adrenal gland, compatible with a  benign adrenal adenoma. Right renal cyst.    No hypermetabolic lymph nodes in the abdomen or pelvis.    SKELETON    Multifocal osseous metastases, including:  --Destructive lytic lesion in the left L3 vertebral body (series 3/  image 143), max SUV 7.6    --Lytic lesion in the left iliac bone (series 3/ image 186), max SUV  5.6    --Lytic lesion in the left anterior acetabulum (series 3/image 197),  max SUV 4.5    --Expansile lytic lesion in the right greater trochanter (series 3/  image 209), max SUV 5.9    --Lytic lesion with anterior cortical disruption in the right  femoral neck (series 3/ image 222), max SUV 7.3   IMPRESSION:  2.6 cm nodule in the medial right upper lobe, compatible with  primary bronchogenic neoplasm.    Multifocal osseous metastases.    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    Enlarged left thyroid gland, non FDG avid, suggesting goiter.      Electronically Signed    By: Julian Hy M.D.    On: 01/31/2014 15:20     Verified By: Julian Hy, M.D.,   Relevent Results:   Relevant Scans and Labs PET scan bone scan CT scans reviewed   Assessment and Plan: Impression:   widespread metastatic disease from probable adenocarcinoma of the lung with significant hit to the L3 vertebral body SI joints as well as right hip in 69 year old female Plan:   at this time based on the  L3 lesion would like to begin urgent radiation therapy to her L spine as well as SI joints. Would plan on delivering 3000 cGy in 10 fractions. I'll also treat 800 cGy in 1 fraction to her right hip for significant metastatic disease and pain in that area. Risks and benefits of treatment were reviewed with the patient and her family. Side effects such as diarrhea possible urinary frequency and urgency, fatigue, alteration of blood counts, all were discussed in detail with the patient. They have consented to treatment and I have ordered CT simulation today and will start treatments tomorrow on an urgent basis.  I would like to take this opportunity for allowing me to participate in the care of your patient..  Fax to Physician:  [facility] To [facility] Fax: Casilda Carls, MD - [phone].  Electronic Signatures: Armstead Peaks (MD)  (Signed 04-Jan-16 12:49)  Authored: HPI, Diagnosis, Past Hx, PFSH, Allergies, Home Meds, ROS, Nursing Notes, Physical Exam, Other Results, Relevent Results, Encounter Assessment and Plan, Fax to Physician   Last Updated: 04-Jan-16 12:49 by Armstead Peaks (MD)

## 2014-06-11 NOTE — Op Note (Signed)
PATIENT NAME:  Sharon Duran, Sharon Duran MR#:  [id number] DATE OF BIRTH:  1945-02-24  DATE OF PROCEDURE:  04/04/2014  PREOPERATIVE DIAGNOSIS: Lung carcinoma.   POSTOPERATIVE DIAGNOSIS: Lung carcinoma.  PROCEDURE PERFORMED: Insertion of right internal jugular Infuse-a-Port with fluoroscopic and ultrasound guidance.   SURGEON: Hortencia Pilar, M.D.   SEDATION: Versed plus fentanyl.   CONTRAST USED: None.   FLUOROSCOPY TIME: Less than 1 minute.   INDICATIONS: Ms. Faraci is a 69 year old woman who has lung carcinoma and is undergoing chemotherapy and therefore requires adequate intravenous access. Infuse-a-Port is being placed. Risks and benefits were reviewed. The patient has agreed to proceed.   DESCRIPTION OF PROCEDURE: The patient is taken to the special procedure suite, placed in the supine position after adequate sedation was achieved. She is positioned with her neck extended slightly and rotated to the left. Right neck and chest wall are prepped and draped in a sterile fashion. Ultrasound is placed in a sterile sleeve. Jugular vein is identified. It is echolucent and compressible. After localizing the jugular vein and verifying patency, 1% lidocaine is infiltrated in the soft tissues at the base of the neck as well as on the chest wall after adequate infiltration with a total of 30 mL of lidocaine with epinephrine. Ultrasound is once again utilized to identify the jugular vein and under real-time visualization, after an image is recorded a Seldinger needle is inserted. J-wire is advanced. A small incision is made with an 11 blade. Transverse incision is made 2 fingerbreadths below the clavicle and a pocket fashioned with both blunt and sharp dissection. The pocket is tested for appropriate size and then the catheter tunneled subcutaneously from the pocket to the neck. Dilator and peel-away sheath is inserted over the wire and subsequently the catheter is fed through the peel-away sheath and the peel-away  sheath is removed. Under fluoroscopic guidance catheter tip is positioned at the atriocaval junction. The catheter is then transected, connected to the hub and the hub is slipped into the pocket. The Port-A-Cath is then accessed percutaneously with a Huber needle. It aspirates easily and flushes well, and under fluoroscopy, tip is in good position with a smooth contour.   The pocket is then closed in layers using interrupted 3-0 Vicryl, followed by 4-0 Monocryl subcuticular. Neck counterincision is closed with 4-0 Monocryl subcuticular. Dermabond is applied. The patient tolerated the procedure well and there were no immediate complications.    ____________________________ Katha Cabal, MD ggs:mc D: 04/05/2014 10:49:39 ET T: 04/05/2014 13:33:41 ET JOB#: [id number]  cc: Katha Cabal, MD, <Dictator> Katha Cabal MD ELECTRONICALLY SIGNED 04/18/2014 14:25

## 2014-06-12 ENCOUNTER — Other Ambulatory Visit: Payer: Self-pay | Admitting: *Deleted

## 2014-06-12 ENCOUNTER — Telehealth: Payer: Self-pay | Admitting: *Deleted

## 2014-06-12 DIAGNOSIS — C7952 Secondary malignant neoplasm of bone marrow: Secondary | ICD-10-CM

## 2014-06-12 DIAGNOSIS — C801 Malignant (primary) neoplasm, unspecified: Principal | ICD-10-CM

## 2014-06-12 NOTE — Telephone Encounter (Signed)
Called and spoke to pt and I told her that Dr. Ma Hillock thought the discomfort might have come from her straining from [facility] and some discomfort like reflux.  Cont. To take nausea med, and use maalox or mylanta and take omeprazole bid.  She states she will get maalox or mylanta and she already is taking omeprazole bid already and she will call back if she does not get any better in next couple of days

## 2014-06-13 ENCOUNTER — Inpatient Hospital Stay: Payer: Medicare HMO

## 2014-06-13 ENCOUNTER — Inpatient Hospital Stay: Payer: Medicare HMO | Attending: Internal Medicine

## 2014-06-13 DIAGNOSIS — Z79899 Other long term (current) drug therapy: Secondary | ICD-10-CM | POA: Diagnosis not present

## 2014-06-13 DIAGNOSIS — C3491 Malignant neoplasm of unspecified part of right bronchus or lung: Secondary | ICD-10-CM

## 2014-06-13 DIAGNOSIS — C7952 Secondary malignant neoplasm of bone marrow: Secondary | ICD-10-CM

## 2014-06-13 DIAGNOSIS — C7951 Secondary malignant neoplasm of bone: Secondary | ICD-10-CM | POA: Insufficient documentation

## 2014-06-13 DIAGNOSIS — D649 Anemia, unspecified: Secondary | ICD-10-CM | POA: Diagnosis not present

## 2014-06-13 DIAGNOSIS — E119 Type 2 diabetes mellitus without complications: Secondary | ICD-10-CM | POA: Insufficient documentation

## 2014-06-13 DIAGNOSIS — C801 Malignant (primary) neoplasm, unspecified: Secondary | ICD-10-CM

## 2014-06-13 DIAGNOSIS — I10 Essential (primary) hypertension: Secondary | ICD-10-CM | POA: Diagnosis not present

## 2014-06-13 DIAGNOSIS — C3411 Malignant neoplasm of upper lobe, right bronchus or lung: Secondary | ICD-10-CM | POA: Insufficient documentation

## 2014-06-13 DIAGNOSIS — R600 Localized edema: Secondary | ICD-10-CM | POA: Insufficient documentation

## 2014-06-13 LAB — CBC WITH DIFFERENTIAL/PLATELET
BASOS ABS: 0 10*3/uL (ref 0–0.1)
Eosinophils Absolute: 0 10*3/uL (ref 0–0.7)
Eosinophils Relative: 1 %
HCT: 24.6 % — ABNORMAL LOW (ref 35.0–47.0)
Hemoglobin: 8 g/dL — ABNORMAL LOW (ref 12.0–16.0)
LYMPHS ABS: 0.1 10*3/uL — AB (ref 1.0–3.6)
MCH: 28.9 pg (ref 26.0–34.0)
MCHC: 32.7 g/dL (ref 32.0–36.0)
MCV: 88.4 fL (ref 80.0–100.0)
Monocytes Absolute: 0.1 10*3/uL — ABNORMAL LOW (ref 0.2–0.9)
Monocytes Relative: 5 %
Neutro Abs: 1.6 10*3/uL (ref 1.4–6.5)
PLATELETS: 67 10*3/uL — AB (ref 150–440)
RBC: 2.78 MIL/uL — AB (ref 3.80–5.20)
RDW: 18.7 % — AB (ref 11.5–14.5)
WBC: 1.9 10*3/uL — AB (ref 3.6–11.0)

## 2014-06-13 LAB — POTASSIUM: Potassium: 3.5 mmol/L (ref 3.5–5.1)

## 2014-06-20 ENCOUNTER — Inpatient Hospital Stay: Payer: Medicare HMO

## 2014-06-20 ENCOUNTER — Other Ambulatory Visit: Payer: Self-pay

## 2014-06-20 DIAGNOSIS — C799 Secondary malignant neoplasm of unspecified site: Secondary | ICD-10-CM

## 2014-06-20 DIAGNOSIS — C7952 Secondary malignant neoplasm of bone marrow: Secondary | ICD-10-CM

## 2014-06-20 DIAGNOSIS — C3411 Malignant neoplasm of upper lobe, right bronchus or lung: Secondary | ICD-10-CM | POA: Diagnosis not present

## 2014-06-20 DIAGNOSIS — C801 Malignant (primary) neoplasm, unspecified: Principal | ICD-10-CM

## 2014-06-20 LAB — CBC WITH DIFFERENTIAL/PLATELET
BASOS ABS: 0 10*3/uL (ref 0–0.1)
Basophils Relative: 0 %
Eosinophils Absolute: 0 10*3/uL (ref 0–0.7)
Eosinophils Relative: 1 %
HEMATOCRIT: 21.9 % — AB (ref 35.0–47.0)
Hemoglobin: 7 g/dL — ABNORMAL LOW (ref 12.0–16.0)
Lymphs Abs: 0.2 10*3/uL — ABNORMAL LOW (ref 1.0–3.6)
MCH: 28.6 pg (ref 26.0–34.0)
MCHC: 32 g/dL (ref 32.0–36.0)
MCV: 89.2 fL (ref 80.0–100.0)
Monocytes Absolute: 0.5 10*3/uL (ref 0.2–0.9)
Monocytes Relative: 12 %
Neutro Abs: 3.6 10*3/uL (ref 1.4–6.5)
Neutrophils Relative %: 83 %
Platelets: 82 10*3/uL — ABNORMAL LOW (ref 150–440)
RBC: 2.46 MIL/uL — AB (ref 3.80–5.20)
RDW: 18.8 % — AB (ref 11.5–14.5)
WBC: 4.3 10*3/uL (ref 3.6–11.0)

## 2014-06-20 LAB — POTASSIUM: Potassium: 4 mmol/L (ref 3.5–5.1)

## 2014-06-20 LAB — HEPATIC FUNCTION PANEL
ALT: 21 U/L (ref 14–54)
AST: 30 U/L (ref 15–41)
Albumin: 2.2 g/dL — ABNORMAL LOW (ref 3.5–5.0)
Alkaline Phosphatase: 63 U/L (ref 38–126)
BILIRUBIN DIRECT: 0.1 mg/dL (ref 0.1–0.5)
Indirect Bilirubin: 0.3 mg/dL (ref 0.3–0.9)
Total Bilirubin: 0.4 mg/dL (ref 0.3–1.2)
Total Protein: 6.1 g/dL — ABNORMAL LOW (ref 6.5–8.1)

## 2014-06-20 LAB — SAMPLE TO BLOOD BANK

## 2014-06-20 LAB — CREATININE, SERUM
CREATININE: 0.96 mg/dL (ref 0.44–1.00)
GFR calc Af Amer: >60 mL/min (ref >60)
GFR calc non Af Amer: 59 mL/min — ABNORMAL LOW (ref >60)

## 2014-06-21 ENCOUNTER — Other Ambulatory Visit: Payer: Self-pay | Admitting: Internal Medicine

## 2014-06-21 ENCOUNTER — Other Ambulatory Visit: Payer: Medicare HMO | Admitting: *Deleted

## 2014-06-21 DIAGNOSIS — D61810 Antineoplastic chemotherapy induced pancytopenia: Secondary | ICD-10-CM

## 2014-06-21 DIAGNOSIS — T451X5A Adverse effect of antineoplastic and immunosuppressive drugs, initial encounter: Principal | ICD-10-CM

## 2014-06-21 DIAGNOSIS — C3411 Malignant neoplasm of upper lobe, right bronchus or lung: Secondary | ICD-10-CM | POA: Diagnosis not present

## 2014-06-22 ENCOUNTER — Inpatient Hospital Stay: Payer: Medicare HMO

## 2014-06-22 LAB — ABO/RH: ABO/RH(D): B NEG

## 2014-06-23 ENCOUNTER — Inpatient Hospital Stay: Payer: Medicare HMO

## 2014-06-23 ENCOUNTER — Telehealth: Payer: Self-pay | Admitting: *Deleted

## 2014-06-23 ENCOUNTER — Other Ambulatory Visit: Payer: Self-pay | Admitting: *Deleted

## 2014-06-23 VITALS — BP 145/81 | HR 88 | Temp 97.7°F | Resp 18

## 2014-06-23 DIAGNOSIS — C3411 Malignant neoplasm of upper lobe, right bronchus or lung: Secondary | ICD-10-CM | POA: Diagnosis not present

## 2014-06-23 DIAGNOSIS — T451X5A Adverse effect of antineoplastic and immunosuppressive drugs, initial encounter: Principal | ICD-10-CM

## 2014-06-23 DIAGNOSIS — D61810 Antineoplastic chemotherapy induced pancytopenia: Secondary | ICD-10-CM

## 2014-06-23 MED ORDER — ACETAMINOPHEN 325 MG PO TABS
650.0000 mg | ORAL_TABLET | Freq: Once | ORAL | Status: AC
  Administered 2014-06-23: 650 mg via ORAL
  Filled 2014-06-23: qty 2

## 2014-06-23 MED ORDER — SODIUM CHLORIDE 0.9 % IJ SOLN
10.0000 mL | INTRAMUSCULAR | Status: AC | PRN
  Administered 2014-06-23: 10 mL
  Filled 2014-06-23: qty 10

## 2014-06-23 MED ORDER — SODIUM CHLORIDE 0.9 % IV SOLN
250.0000 mL | Freq: Once | INTRAVENOUS | Status: AC
  Administered 2014-06-23: 250 mL via INTRAVENOUS
  Filled 2014-06-23: qty 250

## 2014-06-23 MED ORDER — HEPARIN SOD (PORK) LOCK FLUSH 100 UNIT/ML IV SOLN
500.0000 [IU] | Freq: Every day | INTRAVENOUS | Status: AC | PRN
  Administered 2014-06-23: 500 [IU]
  Filled 2014-06-23: qty 5

## 2014-06-23 MED ORDER — DIPHENHYDRAMINE HCL 25 MG PO CAPS
25.0000 mg | ORAL_CAPSULE | Freq: Once | ORAL | Status: AC
  Administered 2014-06-23: 25 mg via ORAL
  Filled 2014-06-23: qty 1

## 2014-06-23 NOTE — Telephone Encounter (Signed)
Pt requesting EMLA cream  For her portacath because the facility no longer has spray. emla cream was called into her pharmacy

## 2014-06-26 LAB — TYPE AND SCREEN
ABO/RH(D): B NEG
Antibody Screen: NEGATIVE
UNIT DIVISION: 0

## 2014-06-27 ENCOUNTER — Inpatient Hospital Stay: Payer: Medicare HMO

## 2014-06-27 ENCOUNTER — Inpatient Hospital Stay (HOSPITAL_BASED_OUTPATIENT_CLINIC_OR_DEPARTMENT_OTHER): Payer: Medicare HMO | Admitting: Internal Medicine

## 2014-06-27 VITALS — BP 165/74 | HR 101 | Temp 96.2°F | Ht 63.0 in | Wt 202.4 lb

## 2014-06-27 DIAGNOSIS — I10 Essential (primary) hypertension: Secondary | ICD-10-CM

## 2014-06-27 DIAGNOSIS — D649 Anemia, unspecified: Secondary | ICD-10-CM

## 2014-06-27 DIAGNOSIS — R609 Edema, unspecified: Secondary | ICD-10-CM | POA: Diagnosis not present

## 2014-06-27 DIAGNOSIS — C3411 Malignant neoplasm of upper lobe, right bronchus or lung: Secondary | ICD-10-CM | POA: Diagnosis not present

## 2014-06-27 DIAGNOSIS — C801 Malignant (primary) neoplasm, unspecified: Principal | ICD-10-CM

## 2014-06-27 DIAGNOSIS — J449 Chronic obstructive pulmonary disease, unspecified: Secondary | ICD-10-CM

## 2014-06-27 DIAGNOSIS — C7951 Secondary malignant neoplasm of bone: Secondary | ICD-10-CM | POA: Diagnosis not present

## 2014-06-27 DIAGNOSIS — C3490 Malignant neoplasm of unspecified part of unspecified bronchus or lung: Secondary | ICD-10-CM | POA: Insufficient documentation

## 2014-06-27 DIAGNOSIS — E119 Type 2 diabetes mellitus without complications: Secondary | ICD-10-CM

## 2014-06-27 DIAGNOSIS — C3491 Malignant neoplasm of unspecified part of right bronchus or lung: Secondary | ICD-10-CM

## 2014-06-27 DIAGNOSIS — Z79899 Other long term (current) drug therapy: Secondary | ICD-10-CM

## 2014-06-27 DIAGNOSIS — C7952 Secondary malignant neoplasm of bone marrow: Secondary | ICD-10-CM

## 2014-06-27 LAB — CBC WITH DIFFERENTIAL/PLATELET
Basophils Absolute: 0 10*3/uL (ref 0–0.1)
Basophils Relative: 0 %
Eosinophils Absolute: 0 10*3/uL (ref 0–0.7)
Eosinophils Relative: 1 %
HEMATOCRIT: 28 % — AB (ref 35.0–47.0)
HEMOGLOBIN: 9.1 g/dL — AB (ref 12.0–16.0)
Lymphocytes Relative: 6 %
Lymphs Abs: 0.3 10*3/uL — ABNORMAL LOW (ref 1.0–3.6)
MCH: 29.4 pg (ref 26.0–34.0)
MCHC: 32.5 g/dL (ref 32.0–36.0)
MCV: 90.2 fL (ref 80.0–100.0)
MONOS PCT: 19 %
Monocytes Absolute: 0.9 10*3/uL (ref 0.2–0.9)
NEUTROS ABS: 3.5 10*3/uL (ref 1.4–6.5)
Neutrophils Relative %: 74 %
Platelets: 241 10*3/uL (ref 150–440)
RBC: 3.1 MIL/uL — ABNORMAL LOW (ref 3.80–5.20)
RDW: 19.4 % — ABNORMAL HIGH (ref 11.5–14.5)
WBC: 4.7 10*3/uL (ref 3.6–11.0)

## 2014-06-27 LAB — BASIC METABOLIC PANEL
Anion gap: 7 (ref 5–15)
BUN: 13 mg/dL (ref 6–20)
CHLORIDE: 98 mmol/L — AB (ref 101–111)
CO2: 34 mmol/L — ABNORMAL HIGH (ref 22–32)
Calcium: 9.5 mg/dL (ref 8.9–10.3)
Creatinine, Ser: 1.01 mg/dL — ABNORMAL HIGH (ref 0.44–1.00)
GFR calc Af Amer: >60 mL/min (ref >60)
GFR calc non Af Amer: 56 mL/min — ABNORMAL LOW (ref >60)
GLUCOSE: 138 mg/dL — AB (ref 65–99)
POTASSIUM: 4.1 mmol/L (ref 3.5–5.1)
Sodium: 139 mmol/L (ref 135–145)

## 2014-06-27 MED ORDER — SODIUM CHLORIDE 0.9 % IV SOLN
500.0000 mg/m2 | Freq: Once | INTRAVENOUS | Status: AC
  Administered 2014-06-27: 1000 mg via INTRAVENOUS
  Filled 2014-06-27: qty 40

## 2014-06-27 MED ORDER — SODIUM CHLORIDE 0.9 % IV SOLN
Freq: Once | INTRAVENOUS | Status: AC
  Administered 2014-06-27: 16:00:00 via INTRAVENOUS
  Filled 2014-06-27: qty 250

## 2014-06-27 MED ORDER — HEPARIN SOD (PORK) LOCK FLUSH 100 UNIT/ML IV SOLN
500.0000 [IU] | Freq: Once | INTRAVENOUS | Status: AC | PRN
  Administered 2014-06-27: 500 [IU]

## 2014-06-27 MED ORDER — CYANOCOBALAMIN 1000 MCG/ML IJ SOLN
1000.0000 ug | Freq: Once | INTRAMUSCULAR | Status: AC
  Filled 2014-06-27: qty 1

## 2014-06-27 MED ORDER — SODIUM CHLORIDE 0.9 % IV SOLN
Freq: Once | INTRAVENOUS | Status: AC
  Administered 2014-06-27: 16:00:00 via INTRAVENOUS
  Filled 2014-06-27: qty 4

## 2014-06-27 MED ORDER — CYANOCOBALAMIN 1000 MCG/ML IJ SOLN
1000.0000 ug | INTRAMUSCULAR | Status: AC
  Administered 2014-06-27: 1000 ug via INTRAMUSCULAR

## 2014-06-28 LAB — PREPARE RBC (CROSSMATCH)

## 2014-06-30 ENCOUNTER — Other Ambulatory Visit: Payer: Self-pay | Admitting: *Deleted

## 2014-07-04 ENCOUNTER — Inpatient Hospital Stay: Payer: Medicare HMO

## 2014-07-04 DIAGNOSIS — C3411 Malignant neoplasm of upper lobe, right bronchus or lung: Secondary | ICD-10-CM | POA: Diagnosis not present

## 2014-07-04 DIAGNOSIS — C3491 Malignant neoplasm of unspecified part of right bronchus or lung: Secondary | ICD-10-CM

## 2014-07-04 LAB — CBC WITH DIFFERENTIAL/PLATELET
Basophils Absolute: 0 10*3/uL (ref 0–0.1)
EOS ABS: 0 10*3/uL (ref 0–0.7)
HEMATOCRIT: 22.4 % — AB (ref 35.0–47.0)
HEMOGLOBIN: 7.2 g/dL — AB (ref 12.0–16.0)
Lymphs Abs: 0.1 10*3/uL — ABNORMAL LOW (ref 1.0–3.6)
MCH: 29.4 pg (ref 26.0–34.0)
MCHC: 32.3 g/dL (ref 32.0–36.0)
MCV: 91.1 fL (ref 80.0–100.0)
MONO ABS: 0.1 10*3/uL — AB (ref 0.2–0.9)
Monocytes Relative: 6 %
NEUTROS ABS: 1.2 10*3/uL — AB (ref 1.4–6.5)
Neutrophils Relative %: 86 %
Platelets: 85 10*3/uL — ABNORMAL LOW (ref 150–440)
RBC: 2.46 MIL/uL — ABNORMAL LOW (ref 3.80–5.20)
RDW: 18.8 % — AB (ref 11.5–14.5)
WBC: 1.4 10*3/uL — AB (ref 3.6–11.0)

## 2014-07-05 ENCOUNTER — Telehealth: Payer: Self-pay | Admitting: *Deleted

## 2014-07-05 ENCOUNTER — Other Ambulatory Visit: Payer: Self-pay | Admitting: Family Medicine

## 2014-07-05 ENCOUNTER — Other Ambulatory Visit: Payer: Self-pay | Admitting: *Deleted

## 2014-07-05 DIAGNOSIS — D630 Anemia in neoplastic disease: Secondary | ICD-10-CM

## 2014-07-05 NOTE — Telephone Encounter (Signed)
Reviewed lab results from yest and hgb 7.2 and wanted to check to see how pt is doing.  She states she is more tired than usual and also sob on exertion.  Spoke to [place] and she will order 1 unit of blood and pt can come tom. And orders entered and pt will be here tom 9 am.

## 2014-07-06 ENCOUNTER — Inpatient Hospital Stay: Payer: Medicare HMO

## 2014-07-06 VITALS — BP 124/77 | HR 108 | Temp 96.9°F | Resp 18

## 2014-07-06 DIAGNOSIS — C3411 Malignant neoplasm of upper lobe, right bronchus or lung: Secondary | ICD-10-CM | POA: Diagnosis not present

## 2014-07-06 DIAGNOSIS — D630 Anemia in neoplastic disease: Secondary | ICD-10-CM

## 2014-07-06 MED ORDER — SODIUM CHLORIDE 0.9 % IJ SOLN
3.0000 mL | INTRAMUSCULAR | Status: DC | PRN
  Filled 2014-07-06: qty 10

## 2014-07-06 MED ORDER — ACETAMINOPHEN 325 MG PO TABS
650.0000 mg | ORAL_TABLET | Freq: Once | ORAL | Status: AC
  Administered 2014-07-06: 650 mg via ORAL
  Filled 2014-07-06: qty 2

## 2014-07-06 MED ORDER — HEPARIN SOD (PORK) LOCK FLUSH 100 UNIT/ML IV SOLN: 250.0000 [IU] | INTRAVENOUS | Status: DC | PRN

## 2014-07-06 MED ORDER — SODIUM CHLORIDE 0.9 % IV SOLN
250.0000 mL | Freq: Once | INTRAVENOUS | Status: AC
  Administered 2014-07-06: 250 mL via INTRAVENOUS
  Filled 2014-07-06: qty 250

## 2014-07-06 MED ORDER — SODIUM CHLORIDE 0.9 % IJ SOLN
10.0000 mL | INTRAMUSCULAR | Status: AC | PRN
  Administered 2014-07-06: 10 mL
  Filled 2014-07-06: qty 10

## 2014-07-06 MED ORDER — DIPHENHYDRAMINE HCL 25 MG PO CAPS
25.0000 mg | ORAL_CAPSULE | Freq: Once | ORAL | Status: AC
  Administered 2014-07-06: 25 mg via ORAL
  Filled 2014-07-06: qty 1

## 2014-07-06 MED ORDER — HEPARIN SOD (PORK) LOCK FLUSH 100 UNIT/ML IV SOLN
500.0000 [IU] | Freq: Every day | INTRAVENOUS | Status: AC | PRN
  Administered 2014-07-06: 500 [IU]
  Filled 2014-07-06: qty 5

## 2014-07-06 NOTE — Progress Notes (Signed)
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   01/31/14 - PET scan. IMPRESSION:  2.6 cm nodule in the medial right upper lobe, compatible with primary bronchogenic neoplasm. Multifocal osseous metastases. 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 Enlarged left thyroid gland, non FDG avid, suggesting goiter.  02/06/14 - CT-guided biopsy of lumbar L3 lesion - METASTATIC ADENOCARCINOMA. Comment: The bone sample contains extensive metastatic adenocarcinoma with cribriform/glandular architecture and mucin production. Immunohistochemistry was performed in an attempt to identify the site of origin. The neoplastic cells are strongly positive for villin and weakly positive for GATA3. The neoplastic cells are negative for TTF-1 (stain was repeated), Napsin A, PAX8, CDX2, ER, GCDFP-15, and mammaglobin. Controls stained appropriately. The staining pattern is not typical for a lung primary adenocarcinoma, but enteric adenocarcinoma of the lung is a possibility. Breast and ovarian carcinoma are unlikely. Other primary sites are not excluded histologically. There are probably sufficient tumor cells in block A1 (which was not decalcified) for ancillary molecular studies.  Molecular testing for EGFR, ALK, RET and ROS1 all negative.  Started palliative chemo with Alimta/Carboplatin on 03/13/14. Now on single agent Alimta, and on Xgeva for bone metastases.   HISTORY OF PRESENT ILLNESS:  Patient returns for continued oncology evaluation and plan next dose of chemotherapy. States that she is doing steady, pain in legs is under control.  States that her appetite has been good. States that her lower extremity edema is better. She still continues to have chronic significant weakness and fatigability, and only ambulates short distances. No fevers. Feels that chemotherapy is making her more tired but felt that she tolerated single agent Alimta better. Denies falls or loss of consciousness. Pain fluctuates 0-6/10. No new mood disturbances.   REVIEW OF SYSTEMS:   ROS As in HPI above. In addition, no fever, chills or sweats. No new headaches or focal weakness.  No new mood disturbances. No  sore throat or dysphagia. No hemoptysis or chest pain. No dizziness or palpitation. No abdominal pain, constipation, diarrhea, dysuria or hematuria. No new skin rash or bleeding symptoms. No new paresthesias in extremities. PS ECOG 2.  PAST MEDICAL HISTORY:         Hypertension  Hyperlipidemia  Type 2 diabetes mellitus  Sleep apnea  Goiter  Chronic renal insufficiency  GERD  COPD  Cataracts   PAST SURGICAL HISTORY: Partial hysterectomy  FAMILY HISTORY:  remarkable for diabetes, ovarian cancer (mother), lung cancer (sister).    ADVANCED DIRECTIVES:   SOCIAL HISTORY: History  Substance Use Topics  . Smoking status: Not on file  . Smokeless tobacco: Not on file  . Alcohol Use: Not on file  Ex-smoker, quit in 1991, 25-pack-year history. Denies alcohol usage. Tries to be physically active.  Allergies not on file  Current Outpatient Prescriptions  Medication Sig Dispense Refill  . lidocaine-prilocaine (EMLA) cream Apply 1 application topically as needed (as needed with each chemotherapy treatment). Apply cream  Over portacath site 1 hour before each chemotherapy     No current facility-administered medications for this visit.   Facility-Administered Medications Ordered in Other Visits  Medication Dose Route Frequency Provider Last Rate Last Dose  . cyanocobalamin ((VITAMIN  B-12)) injection 1,000 mcg  1,000 mcg Intramuscular Q21 days  Leia Alf, MD   1,000 mcg at 06/27/14 1608  . cyanocobalamin ((VITAMIN B-12)) injection 1,000 mcg  1,000 mcg Intramuscular Once Leia Alf, MD        OBJECTIVE: Filed Vitals:   06/27/14 1417  BP: 165/74  Pulse: 101  Temp: 96.2 F (35.7 C)     Body mass index is 35.86 kg/(m^2).    ECOG FS:2 - Symptomatic, <50% confined to bed  GENERAL: Patient is in wheelchair as usual, otherwise alert and oriented and in no acute distress. There is no icterus. HEENT: EOMs intact. Oral exam negative for thrush or lesions. No cervical lymphadenopathy. CVS: S1S2, regular LUNGS: Bilaterally good air entry, no rhonchi. ABDOMEN: Soft, nontender. No hepatomegaly clinically.  NEURO: grossly nonfocal, cranial nerves are intact. EXTREMITIES: b/l 1+ pedal edema.  LAB RESULTS: WBC 4.7, Hb 9.1, plts 241, ANC 3.5, Cr 1.01.     Component Value Date/Time   NA 139 06/27/2014 1344   NA 140 06/06/2014 1338   K 4.1 06/27/2014 1344   K 4.1 06/06/2014 1338   CL 98* 06/27/2014 1344   CL 104 06/06/2014 1338   CO2 34* 06/27/2014 1344   CO2 31 06/06/2014 1338   GLUCOSE 138* 06/27/2014 1344   GLUCOSE 132* 06/06/2014 1338   BUN 13 06/27/2014 1344   BUN 15 06/06/2014 1338   CREATININE 1.01* 06/27/2014 1344   CREATININE 1.06* 06/06/2014 1338   CALCIUM 9.5 06/27/2014 1344   CALCIUM 8.6* 06/06/2014 1338   PROT 6.1* 06/20/2014 1610   PROT 6.0* 05/30/2014 1558   ALBUMIN 2.2* 06/20/2014 1610   ALBUMIN 2.3* 05/30/2014 1558   AST 30 06/20/2014 1610   AST 30 05/30/2014 1558   ALT 21 06/20/2014 1610   ALT 18 05/30/2014 1558   ALKPHOS 63 06/20/2014 1610   ALKPHOS 64 05/30/2014 1558   BILITOT 0.4 06/20/2014 1610   GFRNONAA 56* 06/27/2014 1344   GFRNONAA 54* 06/06/2014 1338   GFRAA >60 06/27/2014 1344   GFRAA >60 06/06/2014 1338            STUDIES: 05/11/14 - CT scan of the chest. IMPRESSION: 1. Minimal regression in size of a dominant nodule in the right upper lobe. Additional scattered bile pulmonary  nodular densities are stable. 2. Asymmetrically enlarged left lobe of the thyroid with a dominant nodule, previously interrogated by ultrasound on 01/02/2014. 3. Coronary artery calcification. 4. Left adrenal adenoma.  ASSESSMENT / PLAN:   1. 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 02/06/14 - CT-guided biopsy of lumbar L3 lesion showed METASTATIC ADENOCARCINOMA. Molecular testing for EGFR, ALK, RET and ROS1 all negative. Repeat CT chest from March 31 showed mild decrease in size of dominant lung mass, no new metastasis - reviewed labs from today and discussed with patient and family present. Overall performance status is borderline poor but remains stable, does not have much side effects from current single agent Alimta chemotherapy (has poor performance status and had difficulty tolerating doublet chemotherapy regimen). Have discussed other option of pursuing supportive care/hospice. Patient wants to continue on current treatment. Will proceed with next cycle chemo with Alimta 500 mg/m2 IV today, also will get B12 injection 1000 mcg today. Will monitor weekly labs, get CT chest at 2  weeks to assess continued response to treatment and see her back at 3 weeks with CBC, met-B and make continued treatment planning.  2. Anemia - on ferrous sulfate 325 mg once daily, on folic acid. Continue to monitor and transfuse as indicated.     3. Bone metatasis - on denosumab (Exgeva), continue once every few weeks.     4. Pain - Under much better control - continue current dose of Fentanyl and Roxanol p.r.n.      5. Lower extremity edema - continue diuretics and monitor.          In between visits, she was advised to call or come to ER in case of progressive symptoms  or acute sickness. She is agreeable to this plan   Leia Alf, MD   07/06/2014 11:55 AM

## 2014-07-07 LAB — TYPE AND SCREEN
ABO/RH(D): B NEG
Antibody Screen: NEGATIVE
Unit division: 0

## 2014-07-11 ENCOUNTER — Ambulatory Visit
Admission: RE | Admit: 2014-07-11 | Discharge: 2014-07-11 | Disposition: A | Discharge status: Home / Self Care | Payer: Medicare HMO | Source: Ambulatory Visit | Attending: Internal Medicine | Admitting: Internal Medicine

## 2014-07-11 DIAGNOSIS — E041 Nontoxic single thyroid nodule: Secondary | ICD-10-CM | POA: Diagnosis not present

## 2014-07-11 DIAGNOSIS — I2510 Atherosclerotic heart disease of native coronary artery without angina pectoris: Secondary | ICD-10-CM | POA: Diagnosis not present

## 2014-07-11 DIAGNOSIS — C3491 Malignant neoplasm of unspecified part of right bronchus or lung: Secondary | ICD-10-CM | POA: Diagnosis present

## 2014-07-11 DIAGNOSIS — M479 Spondylosis, unspecified: Secondary | ICD-10-CM | POA: Insufficient documentation

## 2014-07-11 DIAGNOSIS — E279 Disorder of adrenal gland, unspecified: Secondary | ICD-10-CM | POA: Insufficient documentation

## 2014-07-11 LAB — PREPARE RBC (CROSSMATCH)

## 2014-07-18 ENCOUNTER — Inpatient Hospital Stay: Payer: Medicare HMO | Attending: Internal Medicine

## 2014-07-18 ENCOUNTER — Telehealth: Payer: Self-pay | Admitting: Pharmacist

## 2014-07-18 ENCOUNTER — Inpatient Hospital Stay: Payer: Medicare HMO

## 2014-07-18 ENCOUNTER — Inpatient Hospital Stay (HOSPITAL_BASED_OUTPATIENT_CLINIC_OR_DEPARTMENT_OTHER): Payer: Medicare HMO | Admitting: Internal Medicine

## 2014-07-18 VITALS — BP 154/82 | HR 92 | Temp 96.4°F | Resp 18 | Ht 63.0 in | Wt 200.6 lb

## 2014-07-18 VITALS — BP 150/80 | HR 94 | Temp 96.6°F | Resp 18

## 2014-07-18 DIAGNOSIS — C7951 Secondary malignant neoplasm of bone: Secondary | ICD-10-CM

## 2014-07-18 DIAGNOSIS — E119 Type 2 diabetes mellitus without complications: Secondary | ICD-10-CM | POA: Diagnosis not present

## 2014-07-18 DIAGNOSIS — I129 Hypertensive chronic kidney disease with stage 1 through stage 4 chronic kidney disease, or unspecified chronic kidney disease: Secondary | ICD-10-CM

## 2014-07-18 DIAGNOSIS — N189 Chronic kidney disease, unspecified: Secondary | ICD-10-CM | POA: Diagnosis not present

## 2014-07-18 DIAGNOSIS — C3491 Malignant neoplasm of unspecified part of right bronchus or lung: Secondary | ICD-10-CM

## 2014-07-18 DIAGNOSIS — R600 Localized edema: Secondary | ICD-10-CM | POA: Insufficient documentation

## 2014-07-18 DIAGNOSIS — D649 Anemia, unspecified: Secondary | ICD-10-CM

## 2014-07-18 DIAGNOSIS — Z87891 Personal history of nicotine dependence: Secondary | ICD-10-CM

## 2014-07-18 DIAGNOSIS — Z79899 Other long term (current) drug therapy: Secondary | ICD-10-CM | POA: Diagnosis not present

## 2014-07-18 DIAGNOSIS — D630 Anemia in neoplastic disease: Secondary | ICD-10-CM

## 2014-07-18 DIAGNOSIS — C3411 Malignant neoplasm of upper lobe, right bronchus or lung: Secondary | ICD-10-CM | POA: Insufficient documentation

## 2014-07-18 LAB — CBC WITH DIFFERENTIAL/PLATELET
BASOS PCT: 0 %
Basophils Absolute: 0 10*3/uL (ref 0–0.1)
EOS ABS: 0 10*3/uL (ref 0–0.7)
Eosinophils Relative: 0 %
HCT: 21.3 % — ABNORMAL LOW (ref 35.0–47.0)
HEMOGLOBIN: 6.9 g/dL — AB (ref 12.0–16.0)
Lymphocytes Relative: 5 %
Lymphs Abs: 0.2 10*3/uL — ABNORMAL LOW (ref 1.0–3.6)
MCH: 29.9 pg (ref 26.0–34.0)
MCHC: 32.4 g/dL (ref 32.0–36.0)
MCV: 92.2 fL (ref 80.0–100.0)
MONO ABS: 0.8 10*3/uL (ref 0.2–0.9)
MONOS PCT: 20 %
Neutro Abs: 3 10*3/uL (ref 1.4–6.5)
Neutrophils Relative %: 75 %
PLATELETS: 215 10*3/uL (ref 150–440)
RBC: 2.31 MIL/uL — ABNORMAL LOW (ref 3.80–5.20)
RDW: 17.7 % — AB (ref 11.5–14.5)
WBC: 4 10*3/uL (ref 3.6–11.0)

## 2014-07-18 LAB — BASIC METABOLIC PANEL
Anion gap: 9 (ref 5–15)
BUN: 14 mg/dL (ref 6–20)
CO2: 33 mmol/L — ABNORMAL HIGH (ref 22–32)
CREATININE: 1.17 mg/dL — AB (ref 0.44–1.00)
Calcium: 8 mg/dL — ABNORMAL LOW (ref 8.9–10.3)
Chloride: 104 mmol/L (ref 101–111)
GFR calc non Af Amer: 47 mL/min — ABNORMAL LOW (ref >60)
GFR, EST AFRICAN AMERICAN: 54 mL/min — AB (ref >60)
Glucose, Bld: 120 mg/dL — ABNORMAL HIGH (ref 65–99)
POTASSIUM: 4 mmol/L (ref 3.5–5.1)
Sodium: 146 mmol/L — ABNORMAL HIGH (ref 135–145)

## 2014-07-18 LAB — PREPARE RBC (CROSSMATCH)

## 2014-07-18 MED ORDER — DENOSUMAB 120 MG/1.7ML ~~LOC~~ SOLN
120.0000 mg | Freq: Once | SUBCUTANEOUS | Status: AC
  Administered 2014-07-18: 120 mg via SUBCUTANEOUS
  Filled 2014-07-18: qty 1.7

## 2014-07-18 MED ORDER — HEPARIN SOD (PORK) LOCK FLUSH 100 UNIT/ML IV SOLN
500.0000 [IU] | Freq: Once | INTRAVENOUS | Status: AC | PRN
  Administered 2014-07-18: 500 [IU]
  Filled 2014-07-18: qty 5

## 2014-07-18 MED ORDER — SODIUM CHLORIDE 0.9 % IV SOLN
Freq: Once | INTRAVENOUS | Status: AC
  Administered 2014-07-18: 15:00:00 via INTRAVENOUS
  Filled 2014-07-18: qty 250

## 2014-07-18 MED ORDER — SODIUM CHLORIDE 0.9 % IV SOLN
500.0000 mg/m2 | Freq: Once | INTRAVENOUS | Status: AC
  Administered 2014-07-18: 1000 mg via INTRAVENOUS
  Filled 2014-07-18: qty 40

## 2014-07-18 MED ORDER — SODIUM CHLORIDE 0.9 % IV SOLN
Freq: Once | INTRAVENOUS | Status: AC
  Administered 2014-07-18: 15:00:00 via INTRAVENOUS
  Filled 2014-07-18: qty 4

## 2014-07-18 MED ORDER — SODIUM CHLORIDE 0.9 % IJ SOLN
10.0000 mL | INTRAMUSCULAR | Status: DC | PRN
  Administered 2014-07-18: 10 mL
  Filled 2014-07-18: qty 10

## 2014-07-18 NOTE — Telephone Encounter (Signed)
Corrected calcium = 9.44 mg/dl

## 2014-07-20 ENCOUNTER — Inpatient Hospital Stay: Payer: Medicare HMO

## 2014-07-20 VITALS — BP 135/79 | HR 64 | Temp 96.4°F | Resp 18

## 2014-07-20 DIAGNOSIS — C3411 Malignant neoplasm of upper lobe, right bronchus or lung: Secondary | ICD-10-CM | POA: Diagnosis not present

## 2014-07-20 DIAGNOSIS — D630 Anemia in neoplastic disease: Secondary | ICD-10-CM

## 2014-07-20 MED ORDER — SODIUM CHLORIDE 0.9 % IV SOLN
250.0000 mL | Freq: Once | INTRAVENOUS | Status: AC
  Administered 2014-07-20: 250 mL via INTRAVENOUS
  Filled 2014-07-20: qty 250

## 2014-07-20 MED ORDER — DIPHENHYDRAMINE HCL 25 MG PO CAPS
25.0000 mg | ORAL_CAPSULE | Freq: Once | ORAL | Status: AC
  Administered 2014-07-20: 25 mg via ORAL
  Filled 2014-07-20: qty 1

## 2014-07-20 MED ORDER — SODIUM CHLORIDE 0.9 % IJ SOLN
10.0000 mL | INTRAMUSCULAR | Status: AC | PRN
  Administered 2014-07-20: 10 mL
  Filled 2014-07-20: qty 10

## 2014-07-20 MED ORDER — ACETAMINOPHEN 325 MG PO TABS
650.0000 mg | ORAL_TABLET | Freq: Once | ORAL | Status: AC
  Administered 2014-07-20: 650 mg via ORAL
  Filled 2014-07-20: qty 2

## 2014-07-20 MED ORDER — HEPARIN SOD (PORK) LOCK FLUSH 100 UNIT/ML IV SOLN
500.0000 [IU] | Freq: Every day | INTRAVENOUS | Status: AC | PRN
  Administered 2014-07-20: 500 [IU]
  Filled 2014-07-20: qty 5

## 2014-07-21 LAB — TYPE AND SCREEN
ABO/RH(D): B NEG
ANTIBODY SCREEN: NEGATIVE
Unit division: 0

## 2014-07-23 NOTE — Progress Notes (Signed)
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   01/31/14 - PET scan. IMPRESSION: 2.6 cm nodule in the medial right upper lobe, compatible with primary bronchogenic neoplasm. Multifocal osseous metastases. 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 Enlarged left thyroid gland, non FDG avid, suggesting goiter.  02/06/14 - CT-guided biopsy of lumbar L3 lesion - METASTATIC ADENOCARCINOMA. Comment: The bone sample contains extensive metastatic adenocarcinoma with cribriform/glandular architecture and mucin production. Immunohistochemistry was performed in an attempt to identify the site of origin. The neoplastic cells are strongly positive for villin and weakly positive for GATA3. The neoplastic cells are negative for TTF-1 (stain was repeated), Napsin A, PAX8, CDX2, ER, GCDFP-15, and mammaglobin. Controls stained appropriately. The staining pattern is not typical for a lung primary adenocarcinoma, but enteric adenocarcinoma of the lung is a possibility. Breast and ovarian carcinoma are unlikely. Other primary sites are not excluded histologically. There are probably sufficient tumor cells in block A1 (which was not decalcified) for ancillary molecular studies.  Molecular testing for EGFR, ALK, RET and ROS1 all negative.  Started palliative chemo with Alimta/Carboplatin on 03/13/14. Now on single agent Alimta, and on Xgeva for bone metastases.  HISTORY OF PRESENT ILLNESS: Patient returns for continued oncology evaluation and plan next dose of chemotherapy. States that she is doing about the same, chronic weakness and fatigue  is unchanged. She uses wheelchair for longewr distances. States pain in legs is under control. States that her appetite has been good. States that her lower extremity edema is better. No fevers. Feels that chemotherapy is making her more tired but felt that she tolerated single agent Alimta better. Denies falls or loss of consciousness. Pain fluctuates 0-4/10.  REVIEW OF SYSTEMS:   ROS As in HPI above. In addition, no fever, chills. No new headaches or focal weakness.  No new mood disturbances. No  sore throat, hemoptysis or chest pain. No dizziness or palpitation. No abdominal pain, constipation, diarrhea, dysuria or hematuria. No new skin rash or bleeding symptoms. No new paresthesias in extremities. PS ECOG 2.  PAST MEDICAL HISTORY: Reviewed Past Medical History  Diagnosis Date  . Cancer 11/2013    Lung          Hypertension Hyperlipidemia Type 2 diabetes mellitus Sleep apnea Goiter Chronic renal insufficiency GERD COPD         Cataracts  PAST SURGICAL HISTORY:Reviewed Partial hysterectomy  FAMILY HISTORY:Reviewed Remarkable for diabetes, ovarian cancer (mother), lung cancer (sister).   ADVANCED DIRECTIVES:  <no information>  SOCIAL HISTORY: Reviewed Ex-smoker, quit in 1991, 25-pack-year history. Denies alcohol usage.   No Known Allergies  Current Outpatient Prescriptions  Medication Sig Dispense Refill  . amLODipine (NORVASC) 5 MG tablet Take 5 mg by mouth daily.    Marland Kitchen aspirin 81 MG tablet Take 81 mg by mouth daily.    Marland Kitchen atenolol (TENORMIN) 25 MG tablet Take by mouth daily.    . baclofen (LIORESAL) 10 MG tablet Take 10 mg by mouth 3 (three) times daily.    . ciprofloxacin (CIPRO) 500 MG tablet Take 500 mg by mouth 2 (two) times daily.    . fentaNYL (DURAGESIC - DOSED MCG/HR) 50 MCG/HR Place 50 mcg  onto the skin every 3 (three) days.    . Fluticasone-Salmeterol (ADVAIR) 250-50 MCG/DOSE AEPB Inhale 1 puff  into the lungs 2 (two) times daily.    . folic acid (FOLVITE) 1 MG tablet Take 1 mg by mouth daily.    . furosemide (LASIX) 40 MG tablet Take 40 mg by mouth.    . gabapentin (NEURONTIN) 100 MG capsule Take 100 mg by mouth 3 (three) times daily.    Marland Kitchen glipiZIDE (GLUCOTROL XL) 10 MG 24 hr tablet Take 10 mg by mouth daily with breakfast.    . lidocaine-prilocaine (EMLA) cream Apply 1 application topically as needed (as needed with each chemotherapy treatment). Apply cream  Over portacath site 1 hour before each chemotherapy    . LORazepam (ATIVAN) 0.5 MG tablet Take 0.5 mg by mouth every 8 (eight) hours.    Marland Kitchen losartan (COZAAR) 100 MG tablet Take 100 mg by mouth daily.    . metoCLOPramide (REGLAN) 10 MG tablet Take 10 mg by mouth every 8 (eight) hours as needed for nausea.    . montelukast (SINGULAIR) 10 MG tablet Take 10 mg by mouth at bedtime.    Marland Kitchen morphine (ROXANOL) 20 MG/ML concentrated solution Take by mouth every 2 (two) hours as needed for severe pain.    Marland Kitchen omeprazole (PRILOSEC) 20 MG capsule Take 20 mg by mouth daily.    . promethazine (PHENERGAN) 25 MG tablet Take 25 mg by mouth every 6 (six) hours as needed for nausea or vomiting.    . rosuvastatin (CRESTOR) 10 MG tablet Take 10 mg by mouth daily.    Marland Kitchen spironolactone (ALDACTONE) 25 MG tablet Take 25 mg by mouth daily.    . sucralfate (CARAFATE) 1 G tablet Take 1 g by mouth 4 (four) times daily -  with meals and at bedtime.    Marland Kitchen morphine 20 MG/5ML solution Take by mouth every 2 (two) hours as needed for pain.     No current facility-administered medications for this visit.   Facility-Administered Medications Ordered in Other Visits  Medication Dose Route Frequency Provider Last Rate Last Dose  . cyanocobalamin ((VITAMIN B-12)) injection 1,000 mcg  1,000 mcg Intramuscular Q21 days Leia Alf, MD   1,000 mcg at 06/27/14 1608  . cyanocobalamin ((VITAMIN B-12)) injection 1,000 mcg  1,000 mcg Intramuscular Once Leia Alf, MD         OBJECTIVE: Filed Vitals:   07/18/14 1346  BP: 154/82  Pulse: 92  Temp: 96.4 F (35.8 C)  Resp: 18     Body mass index is 35.55 kg/(m^2).    ECOG FS:2 - Symptomatic, <50% confined to bed  GENERAL: Patient is alert and oriented and in no acute distress. There is no icterus. HEENT: EOMs intact. Oral exam negative for thrush or lesions.   CVS: S1S2, regular LUNGS: Bilaterally diminished breath sounds overall, no rhonchi. ABDOMEN: Soft, nontender.    NEURO: grossly nonfocal, cranial nerves are intact.   EXTREMITIES: trace pedal edema, better.   LAB RESULTS:    Component Value Date/Time   NA 146* 07/18/2014 1329   NA 140 06/06/2014 1338   K 4.0 07/18/2014 1329   K 4.1 06/06/2014 1338   CL 104 07/18/2014 1329   CL 104 06/06/2014 1338   CO2 33* 07/18/2014 1329   CO2 31 06/06/2014 1338   GLUCOSE 120* 07/18/2014 1329   GLUCOSE 132* 06/06/2014 1338   BUN 14 07/18/2014 1329   BUN 15 06/06/2014 1338   CREATININE 1.17* 07/18/2014 1329  CREATININE 1.06* 06/06/2014 1338   CALCIUM 8.0* 07/18/2014 1329   CALCIUM 8.6* 06/06/2014 1338   PROT 6.1* 06/20/2014 1610   PROT 6.0* 05/30/2014 1558   ALBUMIN 2.2* 06/20/2014 1610   ALBUMIN 2.3* 05/30/2014 1558   AST 30 06/20/2014 1610   AST 30 05/30/2014 1558   ALT 21 06/20/2014 1610   ALT 18 05/30/2014 1558   ALKPHOS 63 06/20/2014 1610   ALKPHOS 64 05/30/2014 1558   BILITOT 0.4 06/20/2014 1610   GFRNONAA 47* 07/18/2014 1329   GFRNONAA 54* 06/06/2014 1338   GFRAA 54* 07/18/2014 1329   GFRAA >60 06/06/2014 1338   Lab Results  Component Value Date   WBC 4.0 07/18/2014   NEUTROABS 3.0 07/18/2014   HGB 6.9* 07/18/2014   HCT 21.3* 07/18/2014   MCV 92.2 07/18/2014   PLT 215 07/18/2014     STUDIES: 05/11/14 - CT scan of the chest. IMPRESSION: 1. Minimal regression in size of a dominant nodule in the right upper lobe. Additional scattered bile pulmonary nodular densities are stable. 2. Asymmetrically enlarged left lobe of the  thyroid with a dominant nodule, previously interrogated by ultrasound on 01/02/2014. 3. Coronary artery calcification. 4. Left adrenal adenoma.  Ct Chest Wo Contrast  07/11/2014   CLINICAL DATA:  Restaging lung cancer  EXAM: CT CHEST WITHOUT CONTRAST  TECHNIQUE: Multidetector CT imaging of the chest was performed following the standard protocol without IV contrast.  COMPARISON:  05/11/2014  FINDINGS: Mediastinum: There is a large nodule arising from the left lobe of thyroid gland measuring 4.3 cm, image 7 of series 2. The heart size appears within normal limits. There is no pericardial effusion. Aortic atherosclerosis identified. Calcifications within the RCA, LAD and left circumflex coronary arteries noted. Retroesophageal adenopathy is again noted. Index lymph node measures 2 cm, image 30/series 2. Previously 1.9 cm.  Lungs/Pleura: No pleural effusion identified. Moderate changes of centrilobular and paraseptal emphysema. Right upper lobe perihilar nodule measures 1.9 x 1.9 cm, image 25 of series 2. Previously this measured 2.2 by 2.0 cm. Stable tiny nodule in the left upper lobe measuring 4 mm, image 28/series 4. Peribronchovascular nodule within the right lower lobe is unchanged, image 43/series 4. Anterior right upper lobe nodule measures 5 mm, image 29/series 4. This is unchanged.  Upper Abdomen: Left adrenal nodule measures 1.4 x 2.4 cm and is unchanged from previous exam, image 62/series 2. Normal appearance of the right adrenal gland. The visualized portions of the liver are on unremarkable. Normal appearance of the spleen.  Musculoskeletal: There is multi level thoracic spondylosis identified blood.  IMPRESSION: 1. Dominant perihilar nodule in the right upper lobe is stable to minimally decreased in the interval. 2. Similar appearance of retroesophageal adenopathy. 3. Dominant nodule in left lobe of thyroid gland is unchanged. 4. Aortic atherosclerosis and coronary artery calcifications.    Electronically Signed   By: Kerby Moors M.D.   On: 07/11/2014 10:29    STAGING: Stage IV.   ASSESSMENT / PLAN:   1. 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 02/06/14 - CT-guided biopsy of lumbar L3 lesion showed METASTATIC ADENOCARCINOMA. Molecular testing for EGFR, ALK, RET and ROS1 all negative. Repeat CT chest from March 31 showed mild decrease in size of dominant lung mass, no new metastasis - reviewed  labs from today and d/w patient and family present. CT chest repeated on 5/31 has been independently reviewed and discussed also, it shows mild further decrease in malignant lesion in RUL lung. Overall performance status is borderline poor but remains stable, does not have much side effects from current single agent Alimta chemotherapy (has poor performance status and had difficulty tolerating doublet chemotherapy regimen). Have discussed other option of pursuing supportive care/hospice. Patient wants to continue on current treatment. Will proceed with next cycle chemo with Alimta 500 mg/m2 IV today, also will get B12 injection 1000 mcg today. Will monitor weekly labs and see her back at 3 weeks with CBC, met-B and make continued treatment planning.  2. Anemia - on ferrous sulfate 325 mg once daily, on folic acid. Continue to monitor and transfuse as indicated.  3. Bone metatasis - on denosumab (Exgeva), continue once every few weeks.  4. Pain - Under much better control - continue current dose of Fentanyl and Roxanol p.r.n.   5. Lower extremity edema - continue diuretics and monitor.   In between visits, she was advised to call or come to ER in case of progressive symptoms or acute sickness. She is agreeable to this plan       Leia Alf, MD   07/23/2014 11:05 AM

## 2014-07-24 ENCOUNTER — Telehealth: Payer: Self-pay | Admitting: *Deleted

## 2014-07-24 ENCOUNTER — Other Ambulatory Visit: Payer: Self-pay | Admitting: *Deleted

## 2014-07-24 MED ORDER — FENTANYL 75 MCG/HR TD PT72
75.0000 ug | MEDICATED_PATCH | TRANSDERMAL | Status: DC
Start: 2014-07-24 — End: 2014-07-24

## 2014-07-24 MED ORDER — FENTANYL 75 MCG/HR TD PT72: 75.0000 ug | MEDICATED_PATCH | TRANSDERMAL | Status: DC

## 2014-07-24 NOTE — Telephone Encounter (Signed)
Pt needs refill for duragesic 75 mcg.  Printed Rx by dr Grayland Ormond and pt aware it is available for her to pickup at the office.

## 2014-07-25 ENCOUNTER — Inpatient Hospital Stay: Payer: Medicare HMO

## 2014-07-25 DIAGNOSIS — C3491 Malignant neoplasm of unspecified part of right bronchus or lung: Secondary | ICD-10-CM

## 2014-07-25 DIAGNOSIS — C3411 Malignant neoplasm of upper lobe, right bronchus or lung: Secondary | ICD-10-CM | POA: Diagnosis not present

## 2014-07-25 LAB — CBC WITH DIFFERENTIAL/PLATELET
BASOS ABS: 0 10*3/uL (ref 0–0.1)
Eosinophils Absolute: 0 10*3/uL (ref 0–0.7)
Eosinophils Relative: 0 %
HCT: 20.6 % — ABNORMAL LOW (ref 35.0–47.0)
HEMOGLOBIN: 6.6 g/dL — AB (ref 12.0–16.0)
Lymphocytes Relative: 6 %
Lymphs Abs: 0.1 10*3/uL — ABNORMAL LOW (ref 1.0–3.6)
MCH: 29.8 pg (ref 26.0–34.0)
MCHC: 32.1 g/dL (ref 32.0–36.0)
MCV: 92.9 fL (ref 80.0–100.0)
Monocytes Absolute: 0 10*3/uL — ABNORMAL LOW (ref 0.2–0.9)
Monocytes Relative: 3 %
Neutro Abs: 0.8 10*3/uL — ABNORMAL LOW (ref 1.4–6.5)
PLATELETS: 68 10*3/uL — AB (ref 150–440)
RBC: 2.22 MIL/uL — ABNORMAL LOW (ref 3.80–5.20)
RDW: 17.3 % — ABNORMAL HIGH (ref 11.5–14.5)
WBC: 0.9 10*3/uL — CL (ref 3.6–11.0)

## 2014-07-25 LAB — SAMPLE TO BLOOD BANK

## 2014-07-27 ENCOUNTER — Other Ambulatory Visit: Payer: Self-pay | Admitting: Oncology

## 2014-07-27 ENCOUNTER — Other Ambulatory Visit: Payer: Self-pay | Admitting: *Deleted

## 2014-07-27 DIAGNOSIS — C3490 Malignant neoplasm of unspecified part of unspecified bronchus or lung: Secondary | ICD-10-CM

## 2014-07-27 LAB — PREPARE RBC (CROSSMATCH)

## 2014-07-28 ENCOUNTER — Telehealth: Payer: Self-pay | Admitting: *Deleted

## 2014-07-28 ENCOUNTER — Other Ambulatory Visit: Payer: Self-pay | Admitting: Family Medicine

## 2014-07-28 ENCOUNTER — Inpatient Hospital Stay: Payer: Medicare HMO

## 2014-07-28 VITALS — BP 131/73 | HR 102 | Temp 96.6°F | Resp 18

## 2014-07-28 DIAGNOSIS — C3411 Malignant neoplasm of upper lobe, right bronchus or lung: Secondary | ICD-10-CM | POA: Diagnosis not present

## 2014-07-28 DIAGNOSIS — C3490 Malignant neoplasm of unspecified part of unspecified bronchus or lung: Secondary | ICD-10-CM

## 2014-07-28 LAB — URINALYSIS COMPLETE WITH MICROSCOPIC (ARMC ONLY)
Bilirubin Urine: NEGATIVE
GLUCOSE, UA: NEGATIVE mg/dL
KETONES UR: NEGATIVE mg/dL
Nitrite: NEGATIVE
Protein, ur: 30 mg/dL — AB
SPECIFIC GRAVITY, URINE: 1.013 (ref 1.005–1.030)
pH: 5 (ref 5.0–8.0)

## 2014-07-28 MED ORDER — DIPHENHYDRAMINE HCL 25 MG PO CAPS
ORAL_CAPSULE | ORAL | Status: AC
  Filled 2014-07-28: qty 1

## 2014-07-28 MED ORDER — ACETAMINOPHEN 325 MG PO TABS
650.0000 mg | ORAL_TABLET | Freq: Once | ORAL | Status: AC
  Administered 2014-07-28: 650 mg via ORAL
  Filled 2014-07-28: qty 2

## 2014-07-28 MED ORDER — SODIUM CHLORIDE 0.9 % IJ SOLN
10.0000 mL | Freq: Once | INTRAMUSCULAR | Status: AC
  Administered 2014-07-28: 10 mL
  Filled 2014-07-28: qty 10

## 2014-07-28 MED ORDER — HEPARIN SOD (PORK) LOCK FLUSH 100 UNIT/ML IV SOLN
INTRAVENOUS | Status: AC
  Filled 2014-07-28: qty 5

## 2014-07-28 MED ORDER — DIPHENHYDRAMINE HCL 25 MG PO TABS
25.0000 mg | ORAL_TABLET | Freq: Once | ORAL | Status: AC
  Administered 2014-07-28: 25 mg via ORAL
  Filled 2014-07-28: qty 1

## 2014-07-28 MED ORDER — HEPARIN SOD (PORK) LOCK FLUSH 100 UNIT/ML IV SOLN
500.0000 [IU] | Freq: Once | INTRAVENOUS | Status: AC
  Administered 2014-07-28: 500 [IU] via INTRAVENOUS

## 2014-07-28 MED ORDER — SODIUM CHLORIDE 0.9 % IV SOLN
250.0000 mL | Freq: Once | INTRAVENOUS | Status: AC
  Administered 2014-07-28: 250 mL via INTRAVENOUS
  Filled 2014-07-28: qty 250

## 2014-07-28 MED ORDER — FENTANYL 100 MCG/HR TD PT72: 100.0000 ug | MEDICATED_PATCH | TRANSDERMAL | Status: DC

## 2014-07-29 LAB — TYPE AND SCREEN
ABO/RH(D): B NEG
Antibody Screen: NEGATIVE
Unit division: 0

## 2014-07-30 LAB — URINE CULTURE: Culture: >=100000

## 2014-07-31 ENCOUNTER — Emergency Department: Payer: Medicare HMO

## 2014-07-31 ENCOUNTER — Encounter: Payer: Self-pay | Admitting: Emergency Medicine

## 2014-07-31 ENCOUNTER — Emergency Department
Admission: EM | Admit: 2014-07-31 | Discharge: 2014-08-01 | Disposition: A | Discharge status: Home / Self Care | Payer: Medicare HMO | Attending: Emergency Medicine | Admitting: Emergency Medicine

## 2014-07-31 ENCOUNTER — Other Ambulatory Visit: Payer: Self-pay

## 2014-07-31 ENCOUNTER — Other Ambulatory Visit: Payer: Self-pay | Admitting: Internal Medicine

## 2014-07-31 ENCOUNTER — Telehealth: Payer: Self-pay | Admitting: *Deleted

## 2014-07-31 DIAGNOSIS — D696 Thrombocytopenia, unspecified: Secondary | ICD-10-CM | POA: Diagnosis not present

## 2014-07-31 DIAGNOSIS — R109 Unspecified abdominal pain: Secondary | ICD-10-CM | POA: Diagnosis present

## 2014-07-31 DIAGNOSIS — Z87891 Personal history of nicotine dependence: Secondary | ICD-10-CM | POA: Diagnosis not present

## 2014-07-31 DIAGNOSIS — E119 Type 2 diabetes mellitus without complications: Secondary | ICD-10-CM | POA: Diagnosis not present

## 2014-07-31 DIAGNOSIS — Z7951 Long term (current) use of inhaled steroids: Secondary | ICD-10-CM | POA: Diagnosis not present

## 2014-07-31 DIAGNOSIS — Z79899 Other long term (current) drug therapy: Secondary | ICD-10-CM | POA: Insufficient documentation

## 2014-07-31 DIAGNOSIS — D649 Anemia, unspecified: Secondary | ICD-10-CM | POA: Insufficient documentation

## 2014-07-31 DIAGNOSIS — Z7982 Long term (current) use of aspirin: Secondary | ICD-10-CM | POA: Diagnosis not present

## 2014-07-31 DIAGNOSIS — C3490 Malignant neoplasm of unspecified part of unspecified bronchus or lung: Secondary | ICD-10-CM

## 2014-07-31 HISTORY — DX: Type 2 diabetes mellitus without complications: E11.9

## 2014-07-31 HISTORY — DX: Chronic obstructive pulmonary disease, unspecified: J44.9

## 2014-07-31 LAB — CBC WITH DIFFERENTIAL/PLATELET
Basophils Absolute: 0 10*3/uL (ref 0–0.1)
Basophils Relative: 0 %
EOS ABS: 0 10*3/uL (ref 0–0.7)
Eosinophils Relative: 0 %
HCT: 20.2 % — ABNORMAL LOW (ref 35.0–47.0)
HEMOGLOBIN: 6.6 g/dL — AB (ref 12.0–16.0)
Lymphocytes Relative: 3 %
Lymphs Abs: 0.1 10*3/uL — ABNORMAL LOW (ref 1.0–3.6)
MCH: 29.6 pg (ref 26.0–34.0)
MCHC: 32.7 g/dL (ref 32.0–36.0)
MCV: 90.7 fL (ref 80.0–100.0)
Monocytes Absolute: 0.2 10*3/uL (ref 0.2–0.9)
NEUTROS ABS: 2 10*3/uL (ref 1.4–6.5)
Neutrophils Relative %: 87 %
PLATELETS: 20 10*3/uL — AB (ref 150–440)
RBC: 2.23 MIL/uL — ABNORMAL LOW (ref 3.80–5.20)
RDW: 16.9 % — AB (ref 11.5–14.5)
WBC: 2.3 10*3/uL — ABNORMAL LOW (ref 3.6–11.0)

## 2014-07-31 LAB — PREPARE RBC (CROSSMATCH)

## 2014-07-31 LAB — LIPASE, BLOOD: LIPASE: 33 U/L (ref 22–51)

## 2014-07-31 LAB — URINALYSIS COMPLETE WITH MICROSCOPIC (ARMC ONLY)
BILIRUBIN URINE: NEGATIVE
Bacteria, UA: NONE SEEN
Glucose, UA: NEGATIVE mg/dL
Ketones, ur: NEGATIVE mg/dL
Nitrite: NEGATIVE
PH: 5 (ref 5.0–8.0)
Protein, ur: 30 mg/dL — AB
Specific Gravity, Urine: 1.019 (ref 1.005–1.030)

## 2014-07-31 LAB — COMPREHENSIVE METABOLIC PANEL
ALBUMIN: 1.8 g/dL — AB (ref 3.5–5.0)
ALK PHOS: 110 U/L (ref 38–126)
ALT: 33 U/L (ref 14–54)
ANION GAP: 7 (ref 5–15)
AST: 38 U/L (ref 15–41)
BILIRUBIN TOTAL: 0.7 mg/dL (ref 0.3–1.2)
BUN: 30 mg/dL — ABNORMAL HIGH (ref 6–20)
CO2: 26 mmol/L (ref 22–32)
Calcium: 6.8 mg/dL — ABNORMAL LOW (ref 8.9–10.3)
Chloride: 104 mmol/L (ref 101–111)
Creatinine, Ser: 1.51 mg/dL — ABNORMAL HIGH (ref 0.44–1.00)
GFR calc non Af Amer: 34 mL/min — ABNORMAL LOW (ref >60)
GFR, EST AFRICAN AMERICAN: 40 mL/min — AB (ref >60)
GLUCOSE: 166 mg/dL — AB (ref 65–99)
Potassium: 3.9 mmol/L (ref 3.5–5.1)
Sodium: 137 mmol/L (ref 135–145)
Total Protein: 5.8 g/dL — ABNORMAL LOW (ref 6.5–8.1)

## 2014-07-31 MED ORDER — SODIUM CHLORIDE 0.9 % IV SOLN
10.0000 mL/h | Freq: Once | INTRAVENOUS | Status: AC
  Administered 2014-07-31: 10 mL/h via INTRAVENOUS

## 2014-07-31 MED ORDER — ACETAMINOPHEN 325 MG PO TABS
ORAL_TABLET | ORAL | Status: AC
  Administered 2014-07-31: 650 mg via ORAL
  Filled 2014-07-31: qty 2

## 2014-07-31 MED ORDER — IOHEXOL 240 MG/ML SOLN: 25.0000 mL | Freq: Once | INTRAMUSCULAR | Status: AC | PRN

## 2014-07-31 MED ORDER — HEPARIN SOD (PORK) LOCK FLUSH 100 UNIT/ML IV SOLN
500.0000 [IU] | Freq: Once | INTRAVENOUS | Status: AC
  Administered 2014-07-31: 500 [IU] via INTRAVENOUS

## 2014-07-31 MED ORDER — HEPARIN SOD (PORK) LOCK FLUSH 100 UNIT/ML IV SOLN
INTRAVENOUS | Status: AC
  Administered 2014-07-31: 500 [IU] via INTRAVENOUS
  Filled 2014-07-31: qty 5

## 2014-07-31 MED ORDER — ACETAMINOPHEN 325 MG PO TABS
650.0000 mg | ORAL_TABLET | Freq: Once | ORAL | Status: AC
  Administered 2014-07-31: 650 mg via ORAL

## 2014-07-31 NOTE — Telephone Encounter (Signed)
1. Will order abdominal ultrasound to evaluate lump under umbilicus (for tomorrow). 2. Will order RLE venous doppler (for tomorrow). 3. See me on 6/21 after above tests are done. 4. Continue on Cipro since urine c/s shows E.coli sensitive to it.

## 2014-07-31 NOTE — ED Notes (Signed)
Pt reports abd pain, nausea x 1 wk.  States her MD started her on an antibiotic and miralax but not helping.

## 2014-07-31 NOTE — ED Provider Notes (Signed)
[facility] Emergency Department Provider Note    ____________________________________________  Time seen: 1605  I have reviewed the triage vital signs and the nursing notes.   HISTORY  Chief Complaint Abdominal Pain   History limited by: Not Limited   HPI Sharon Duran is a 69 y.o. female who presents to the emergency department today with abdominal pain and nausea. This is been going on for roughly 1 week. The abdominal pain is located periumbilically. It is moderate.The patient did see her primary care doctor which started on an antibiotic for urinary tract infection. Patient states she has continued to have the pain. The patient denies any fevers.   Past Medical History  Diagnosis Date  . Cancer 11/2013    Lung  . COPD (chronic obstructive pulmonary disease)   . Diabetes mellitus without complication     Patient Active Problem List   Diagnosis Date Noted  . Non-small cell lung cancer 06/27/2014    History reviewed. No pertinent past surgical history.  Current Outpatient Rx  Name  Route  Sig  Dispense  Refill  . amLODipine (NORVASC) 5 MG tablet   Oral   Take 5 mg by mouth daily.         Marland Kitchen aspirin 81 MG tablet   Oral   Take 81 mg by mouth daily.         Marland Kitchen atenolol (TENORMIN) 25 MG tablet   Oral   Take by mouth daily.         . baclofen (LIORESAL) 10 MG tablet   Oral   Take 10 mg by mouth 3 (three) times daily.         . ciprofloxacin (CIPRO) 500 MG tablet   Oral   Take 500 mg by mouth 2 (two) times daily.         . fentaNYL (DURAGESIC) 100 MCG/HR   Transdermal   Place 1 patch (100 mcg total) onto the skin every 3 (three) days.   10 patch   0   . Fluticasone-Salmeterol (ADVAIR) 250-50 MCG/DOSE AEPB   Inhalation   Inhale 1 puff into the lungs 2 (two) times daily.         . folic acid (FOLVITE) 1 MG tablet   Oral   Take 1 mg by mouth daily.         . furosemide (LASIX) 40 MG tablet   Oral   Take 40 mg  by mouth.         . gabapentin (NEURONTIN) 100 MG capsule   Oral   Take 100 mg by mouth 3 (three) times daily.         Marland Kitchen glipiZIDE (GLUCOTROL XL) 10 MG 24 hr tablet   Oral   Take 10 mg by mouth daily with breakfast.         . lidocaine-prilocaine (EMLA) cream   Topical   Apply 1 application topically as needed (as needed with each chemotherapy treatment). Apply cream  Over portacath site 1 hour before each chemotherapy         . LORazepam (ATIVAN) 0.5 MG tablet   Oral   Take 0.5 mg by mouth every 8 (eight) hours.         Marland Kitchen losartan (COZAAR) 100 MG tablet   Oral   Take 100 mg by mouth daily.         . metoCLOPramide (REGLAN) 10 MG tablet   Oral   Take 10 mg by mouth every 8 (eight) hours  as needed for nausea.         . montelukast (SINGULAIR) 10 MG tablet   Oral   Take 10 mg by mouth at bedtime.         Marland Kitchen morphine (ROXANOL) 20 MG/ML concentrated solution   Oral   Take by mouth every 2 (two) hours as needed for severe pain.         Marland Kitchen morphine 20 MG/5ML solution   Oral   Take by mouth every 2 (two) hours as needed for pain.         Marland Kitchen omeprazole (PRILOSEC) 20 MG capsule   Oral   Take 20 mg by mouth daily.         . promethazine (PHENERGAN) 25 MG tablet   Oral   Take 25 mg by mouth every 6 (six) hours as needed for nausea or vomiting.         . rosuvastatin (CRESTOR) 10 MG tablet   Oral   Take 10 mg by mouth daily.         Marland Kitchen spironolactone (ALDACTONE) 25 MG tablet   Oral   Take 25 mg by mouth daily.         . sucralfate (CARAFATE) 1 G tablet   Oral   Take 1 g by mouth 4 (four) times daily -  with meals and at bedtime.           Allergies Review of patient's allergies indicates no known allergies.  History reviewed. No pertinent family history.  Social History History  Substance Use Topics  . Smoking status: Former Research scientist (life sciences)  . Smokeless tobacco: Not on file  . Alcohol Use: No    Review of Systems  Constitutional:  Negative for fever. Cardiovascular: Negative for chest pain. Respiratory: Negative for shortness of breath. Gastrointestinal: Positive for abdominal pain Genitourinary: Negative for dysuria. Musculoskeletal: Negative for back pain. Skin: Negative for rash. Neurological: Negative for headaches, focal weakness or numbness.   10-point ROS otherwise negative.  ____________________________________________   PHYSICAL EXAM:  VITAL SIGNS: ED Triage Vitals  Enc Vitals Group     BP 07/31/14 1530 121/67 mmHg     Pulse Rate 07/31/14 1530 129     Resp 07/31/14 1530 20     Temp 07/31/14 1530 98.9 F (37.2 C)     Temp src --      SpO2 07/31/14 1530 96 %     Weight 07/31/14 1530 193 lb (87.544 kg)     Height 07/31/14 1530 '5\' 3"'$  (1.6 m)     Head Cir --      Peak Flow --      Pain Score 07/31/14 1531 9   Constitutional: Alert and oriented. Well appearing and in no distress. Eyes: Conjunctivae are normal. PERRL. Normal extraocular movements. ENT   Head: Normocephalic and atraumatic.   Nose: No congestion/rhinnorhea.   Mouth/Throat: Mucous membranes are moist.   Neck: No stridor. Hematological/Lymphatic/Immunilogical: No cervical lymphadenopathy. Cardiovascular: Normal rate, regular rhythm.  No murmurs, rubs, or gallops. Respiratory: Normal respiratory effort without tachypnea nor retractions. Breath sounds are clear and equal bilaterally. No wheezes/rales/rhonchi. Gastrointestinal: Soft and nontender. No distention. There is no CVA tenderness. Genitourinary: Deferred Musculoskeletal: Normal range of motion in all extremities. No joint effusions.  No lower extremity tenderness nor edema. Neurologic:  Normal speech and language. No gross focal neurologic deficits are appreciated. Speech is normal.  Skin:  Skin is warm, dry and intact. No rash noted. Psychiatric: Mood and affect are normal.  Speech and behavior are normal. Patient exhibits appropriate insight and  judgment.  ____________________________________________    LABS (pertinent positives/negatives)  Labs Reviewed  CBC WITH DIFFERENTIAL/PLATELET - Abnormal; Notable for the following:    WBC 2.3 (*)    RBC 2.23 (*)    Hemoglobin 6.6 (*)    HCT 20.2 (*)    RDW 16.9 (*)    Platelets 20 (*)    Lymphs Abs 0.1 (*)    All other components within normal limits  COMPREHENSIVE METABOLIC PANEL - Abnormal; Notable for the following:    Glucose, Bld 166 (*)    BUN 30 (*)    Creatinine, Ser 1.51 (*)    Calcium 6.8 (*)    Total Protein 5.8 (*)    Albumin 1.8 (*)    GFR calc non Af Amer 34 (*)    GFR calc Af Amer 40 (*)    All other components within normal limits  URINALYSIS COMPLETEWITH MICROSCOPIC ([facility] ONLY) - Abnormal; Notable for the following:    Color, Urine AMBER (*)    APPearance CLOUDY (*)    Hgb urine dipstick 3+ (*)    Protein, ur 30 (*)    Leukocytes, UA 2+ (*)    Squamous Epithelial / LPF 6-30 (*)    All other components within normal limits  LIPASE, BLOOD  TYPE AND SCREEN  PREPARE RBC (CROSSMATCH)     ____________________________________________   EKG  I, Nance Pear, attending physician, personally viewed and interpreted this EKG  EKG Time:1541 Rate: 121 Rhythm: afib w rvr Axis: normal Intervals: qtc 420 QRS: narrow ST changes: no st elevation    ____________________________________________    RADIOLOGY  CT abdomen and pelvis IMPRESSION: Questionable bladder wall thickening, but the bladder is decompressed and difficult to evaluate. Recommend clinical correlation to exclude cystitis.  Small to moderate free fluid in the pelvis and adjacent to the right liver edge.  Stable bony metastatic disease. ____________________________________________   PROCEDURES  Procedure(s) performed: None  Critical Care performed: No  ____________________________________________   INITIAL IMPRESSION / ASSESSMENT AND PLAN / ED COURSE  Pertinent labs  & imaging results that were available during my care of the patient were reviewed by me and considered in my medical decision making (see chart for details).  Patient presented to the emergency department with abdominal pain and discomfort. CT abdomen and pelvis without any obvious etiology of pain however there was some new small fluid. This is somewhat unclear etiology or significance. Patient abdomen is soft and nonrigid. Additionally no leukocytosis or fever to raise concern for significant intra-abdominal infection. Patient was placed on Cipro for UTI and urine culture does show that it is sensitive to Cipro. Discussed with Dr. Ma Hillock  about hemoglobin level. He stated that the hemoglobin was 7. Will transfuse 1 unit of PRBCs. Dr. Verdon Cummins will follow-up with patient couple days for blood recheck. ____________________________________________   FINAL CLINICAL IMPRESSION(S) / ED DIAGNOSES  Final diagnoses:  Anemia, unspecified anemia type  Thrombocytopenia     Nance Pear, MD 07/31/14 2122

## 2014-07-31 NOTE — Telephone Encounter (Signed)
Left message for pt that xrays are being ordered and she is to see Dr Ma Hillock tomorrow and scheduler will call her with times

## 2014-07-31 NOTE — ED Notes (Signed)
To CT scan via stretcher.

## 2014-07-31 NOTE — ED Notes (Signed)
AAOx3.  Blood transfusion infusing.  Patient placed in recliner chair for comfort.  Continue to monitor.

## 2014-07-31 NOTE — Telephone Encounter (Signed)
Patient called back stating she was going to the hospital

## 2014-07-31 NOTE — Discharge Instructions (Signed)
Please seek medical attention for any high fevers, chest pain, shortness of breath, change in behavior, persistent vomiting, bloody stool or any other new or concerning symptoms. Please continue to take your antibiotics for your urinary tract infection.   Abdominal Pain Many things can cause abdominal pain. Usually, abdominal pain is not caused by a disease and will improve without treatment. It can often be observed and treated at home. Your health care provider will do a physical exam and possibly order blood tests and X-rays to help determine the seriousness of your pain. However, in many cases, more time must pass before a clear cause of the pain can be found. Before that point, your health care provider may not know if you need more testing or further treatment. HOME CARE INSTRUCTIONS  Monitor your abdominal pain for any changes. The following actions may help to alleviate any discomfort you are experiencing:  Only take over-the-counter or prescription medicines as directed by your health care provider.  Do not take laxatives unless directed to do so by your health care provider.  Try a clear liquid diet (broth, tea, or water) as directed by your health care provider. Slowly move to a bland diet as tolerated. SEEK MEDICAL CARE IF:  You have unexplained abdominal pain.  You have abdominal pain associated with nausea or diarrhea.  You have pain when you urinate or have a bowel movement.  You experience abdominal pain that wakes you in the night.  You have abdominal pain that is worsened or improved by eating food.  You have abdominal pain that is worsened with eating fatty foods.  You have a fever. SEEK IMMEDIATE MEDICAL CARE IF:   Your pain does not go away within 2 hours.  You keep throwing up (vomiting).  Your pain is felt only in portions of the abdomen, such as the right side or the left lower portion of the abdomen.  You pass bloody or black tarry stools. MAKE SURE  YOU:  Understand these instructions.   Will watch your condition.   Will get help right away if you are not doing well or get worse.  Document Released: 11/06/2004 Document Revised: 02/01/2013 Document Reviewed: 10/06/2012 [facility] Patient Information 2015 [place], [place]. This information is not intended to replace advice given to you by your health care provider. Make sure you discuss any questions you have with your health care provider.

## 2014-07-31 NOTE — ED Notes (Signed)
AAOx3.  Skin warm and dry.  Blood infusing.  Continue to monitor.

## 2014-08-01 ENCOUNTER — Other Ambulatory Visit: Payer: Medicare HMO

## 2014-08-01 ENCOUNTER — Telehealth: Payer: Self-pay | Admitting: *Deleted

## 2014-08-01 ENCOUNTER — Inpatient Hospital Stay: Payer: Medicare HMO

## 2014-08-01 DIAGNOSIS — D649 Anemia, unspecified: Secondary | ICD-10-CM

## 2014-08-01 LAB — TYPE AND SCREEN
ABO/RH(D): B NEG
Antibody Screen: NEGATIVE
UNIT DIVISION: 0

## 2014-08-01 NOTE — Telephone Encounter (Signed)
Left vm to come in 6/23 for lab check adn to keep appt for 6/28

## 2014-08-01 NOTE — ED Notes (Signed)
AAOx3.  Skin warm and dry.  NAD 

## 2014-08-03 ENCOUNTER — Inpatient Hospital Stay: Payer: Medicare HMO

## 2014-08-03 DIAGNOSIS — D649 Anemia, unspecified: Secondary | ICD-10-CM

## 2014-08-03 DIAGNOSIS — C3491 Malignant neoplasm of unspecified part of right bronchus or lung: Secondary | ICD-10-CM

## 2014-08-03 DIAGNOSIS — C3411 Malignant neoplasm of upper lobe, right bronchus or lung: Secondary | ICD-10-CM | POA: Diagnosis not present

## 2014-08-03 LAB — CBC WITH DIFFERENTIAL/PLATELET
BASOS ABS: 0 10*3/uL (ref 0–0.1)
Basophils Relative: 0 %
Eosinophils Absolute: 0 10*3/uL (ref 0–0.7)
Eosinophils Relative: 0 %
HEMATOCRIT: 21.8 % — AB (ref 35.0–47.0)
HEMOGLOBIN: 7.1 g/dL — AB (ref 12.0–16.0)
LYMPHS ABS: 0.1 10*3/uL — AB (ref 1.0–3.6)
MCH: 29 pg (ref 26.0–34.0)
MCHC: 32.7 g/dL (ref 32.0–36.0)
MCV: 88.6 fL (ref 80.0–100.0)
MONO ABS: 0.2 10*3/uL (ref 0.2–0.9)
NEUTROS ABS: 1.6 10*3/uL (ref 1.4–6.5)
Neutrophils Relative %: 84 %
Platelets: 48 10*3/uL — ABNORMAL LOW (ref 150–440)
RBC: 2.46 MIL/uL — ABNORMAL LOW (ref 3.80–5.20)
RDW: 17.2 % — AB (ref 11.5–14.5)
WBC: 1.9 10*3/uL — AB (ref 3.6–11.0)

## 2014-08-03 LAB — SAMPLE TO BLOOD BANK

## 2014-08-03 LAB — HEPATIC FUNCTION PANEL
ALT: 30 U/L (ref 14–54)
AST: 35 U/L (ref 15–41)
Albumin: 2 g/dL — ABNORMAL LOW (ref 3.5–5.0)
Alkaline Phosphatase: 111 U/L (ref 38–126)
BILIRUBIN INDIRECT: 0.5 mg/dL (ref 0.3–0.9)
BILIRUBIN TOTAL: 0.7 mg/dL (ref 0.3–1.2)
Bilirubin, Direct: 0.2 mg/dL (ref 0.1–0.5)
TOTAL PROTEIN: 6.3 g/dL — AB (ref 6.5–8.1)

## 2014-08-03 LAB — BASIC METABOLIC PANEL
Anion gap: 8 (ref 5–15)
BUN: 25 mg/dL — AB (ref 6–20)
CALCIUM: 6.7 mg/dL — AB (ref 8.9–10.3)
CO2: 27 mmol/L (ref 22–32)
CREATININE: 1.29 mg/dL — AB (ref 0.44–1.00)
Chloride: 101 mmol/L (ref 101–111)
GFR calc non Af Amer: 42 mL/min — ABNORMAL LOW (ref >60)
GFR, EST AFRICAN AMERICAN: 48 mL/min — AB (ref >60)
GLUCOSE: 159 mg/dL — AB (ref 65–99)
Potassium: 3.9 mmol/L (ref 3.5–5.1)
Sodium: 136 mmol/L (ref 135–145)

## 2014-08-08 ENCOUNTER — Inpatient Hospital Stay: Payer: Medicare HMO

## 2014-08-08 ENCOUNTER — Inpatient Hospital Stay (HOSPITAL_BASED_OUTPATIENT_CLINIC_OR_DEPARTMENT_OTHER): Payer: Medicare HMO | Admitting: Internal Medicine

## 2014-08-08 ENCOUNTER — Encounter: Payer: Self-pay | Admitting: Internal Medicine

## 2014-08-08 VITALS — BP 149/65 | HR 102 | Resp 22 | Ht 63.0 in | Wt 200.6 lb

## 2014-08-08 DIAGNOSIS — D649 Anemia, unspecified: Secondary | ICD-10-CM | POA: Diagnosis not present

## 2014-08-08 DIAGNOSIS — C3411 Malignant neoplasm of upper lobe, right bronchus or lung: Secondary | ICD-10-CM

## 2014-08-08 DIAGNOSIS — C3490 Malignant neoplasm of unspecified part of unspecified bronchus or lung: Secondary | ICD-10-CM

## 2014-08-08 DIAGNOSIS — C3491 Malignant neoplasm of unspecified part of right bronchus or lung: Secondary | ICD-10-CM

## 2014-08-08 DIAGNOSIS — R600 Localized edema: Secondary | ICD-10-CM | POA: Diagnosis not present

## 2014-08-08 DIAGNOSIS — C7951 Secondary malignant neoplasm of bone: Secondary | ICD-10-CM | POA: Diagnosis not present

## 2014-08-08 DIAGNOSIS — Z79899 Other long term (current) drug therapy: Secondary | ICD-10-CM

## 2014-08-08 LAB — CBC WITH DIFFERENTIAL/PLATELET
BASOS ABS: 0 10*3/uL (ref 0–0.1)
Basophils Relative: 0 %
Eosinophils Absolute: 0 10*3/uL (ref 0–0.7)
Eosinophils Relative: 0 %
HEMATOCRIT: 21.5 % — AB (ref 35.0–47.0)
HEMOGLOBIN: 7.1 g/dL — AB (ref 12.0–16.0)
LYMPHS PCT: 7 %
Lymphs Abs: 0.3 10*3/uL — ABNORMAL LOW (ref 1.0–3.6)
MCH: 29.1 pg (ref 26.0–34.0)
MCHC: 32.8 g/dL (ref 32.0–36.0)
MCV: 88.6 fL (ref 80.0–100.0)
MONO ABS: 0.5 10*3/uL (ref 0.2–0.9)
MONOS PCT: 10 %
Neutro Abs: 3.8 10*3/uL (ref 1.4–6.5)
Neutrophils Relative %: 83 %
Platelets: 91 10*3/uL — ABNORMAL LOW (ref 150–440)
RBC: 2.43 MIL/uL — ABNORMAL LOW (ref 3.80–5.20)
RDW: 16.7 % — ABNORMAL HIGH (ref 11.5–14.5)
WBC: 4.6 10*3/uL (ref 3.6–11.0)

## 2014-08-08 LAB — SAMPLE TO BLOOD BANK

## 2014-08-08 MED ORDER — SODIUM CHLORIDE 0.9 % IJ SOLN
10.0000 mL | INTRAMUSCULAR | Status: DC | PRN
  Administered 2014-08-08: 10 mL via INTRAVENOUS
  Filled 2014-08-08: qty 10

## 2014-08-08 MED ORDER — FENTANYL 25 MCG/HR TD PT72: 25.0000 ug | MEDICATED_PATCH | TRANSDERMAL | Status: DC

## 2014-08-08 MED ORDER — OXYCODONE HCL 5 MG PO TABS: ORAL_TABLET | ORAL | Status: DC

## 2014-08-08 MED ORDER — HEPARIN SOD (PORK) LOCK FLUSH 100 UNIT/ML IV SOLN
500.0000 [IU] | Freq: Once | INTRAVENOUS | Status: AC
  Administered 2014-08-08: 500 [IU] via INTRAVENOUS
  Filled 2014-08-08: qty 5

## 2014-08-08 NOTE — Patient Instructions (Signed)
Please go to the pharmacy and purchase Folic Acid 1 mg.  You will need to take this tablet once daily.  Dr. Ma Hillock has recommended that you take Folic Acid  as a supplement.  You are being treated with a medication called Alimta.  The folic acid supplementation will reduce treatment related side effects of the Alimta.   What is this medicine? FOLIC ACID (FOE lik AS id) is a water-soluble, B complex vitamin. It is in many foods like liver, kidneys, yeast, and leafy green vegetables. It is used to treat megaloblastic anemia and anemia from poor diet in pregnant women, babies, and children. This medicine may be used for other purposes; ask your health care provider or pharmacist if you have questions. What should I tell my health care provider before I take this medicine? They need to know if you have any of these conditions: -kidney disease -pernicious anemia -vitamin B12 deficient anemia -an unusual or allergic reaction to folic acid, other B vitamins, benzyl alcohol, other medicines, foods, dyes, or preservatives -pregnant or trying to get pregnant -breast-feeding How should I use this medicine? This medicine is for injection into a vein, into a muscle, or under the skin. It is usually given by a health care professional in a hospital or clinic setting. If you get this medicine at home, you will be taught how to prepare and give this medicine. Use exactly as directed. Take your medicine at regular intervals. Do not take your medicine more often than directed. It is important that you put your used needles and syringes in a special sharps container. Do not put them in a trash can. If you do not have a sharps container, call your pharmacist or healthcare provider to get one. Talk to your pediatrician regarding the use of this medicine in children. Special care may be needed. Overdosage: If you think you have taken too much of this medicine contact a poison control center or emergency room at  once. NOTE: This medicine is only for you. Do not share this medicine with others. What if I miss a dose? If you miss a dose, take it as soon as you can. If it is almost time for your next dose, take only that dose. Do not take double or extra doses. What may interact with this medicine? -chloramphenicol -cholestyramine -medicines for seizures -methotrexate -nitrofurantoin -pyrimethamine This list may not describe all possible interactions. Give your health care provider a list of all the medicines, herbs, non-prescription drugs, or dietary supplements you use. Also tell them if you smoke, drink alcohol, or use illegal drugs. Some items may interact with your medicine. What should I watch for while using this medicine? Visit your doctor or health care professional for regular check ups. Your doctor may order blood tests. You need to eat a proper diet even while you are taking this vitamin. Taking vitamin supplements is not a substitute for a healthy diet. Ask your doctor or health care provider for good nutrition advice. What side effects may I notice from receiving this medicine? Side effects that you should report to your doctor or health care professional as soon as possible: -allergic reactions such as skin rash or itching, hives, swelling of the lips, mouth, tongue, or throat -chest tightness or pain -breathing problems This list may not describe all possible side effects. Call your doctor for medical advice about side effects. You may report side effects to FDA at 1-800-FDA-1088. Where should I keep my medicine? Keep out of the  reach of children. If you are using this medicine at home, you will be instructed on how to store this medicine. Throw away any unused medicine after the expiration date on the label. NOTE: This sheet is a summary. It may not cover all possible information. If you have questions about this medicine, talk to your doctor, pharmacist, or health care provider.   2015, [employer]. (2007-06-02 17:04:55)

## 2014-08-17 ENCOUNTER — Telehealth: Payer: Self-pay | Admitting: *Deleted

## 2014-08-17 NOTE — Telephone Encounter (Signed)
Called patient and informed her that form is filled out and that she can pick up tomorrow in [place]. Patient states that her daughter will pick up tomorrow in [place].

## 2014-08-19 NOTE — Progress Notes (Signed)
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   01/31/14 - PET scan. IMPRESSION:  2.6 cm nodule in the medial right upper lobe, compatible with primary bronchogenic neoplasm. Multifocal osseous metastases. 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 Enlarged left thyroid gland, non FDG avid, suggesting goiter.  02/06/14 - CT-guided biopsy of lumbar L3 lesion - METASTATIC ADENOCARCINOMA. Comment: The bone sample contains extensive metastatic adenocarcinoma with cribriform/glandular architecture and mucin production. Immunohistochemistry was performed in an attempt to identify the site of origin. The neoplastic cells are strongly positive for villin and weakly positive for GATA3. The neoplastic cells are negative for TTF-1 (stain was repeated), Napsin A, PAX8, CDX2, ER, GCDFP-15, and mammaglobin. Controls stained appropriately. The staining pattern is not typical for a lung primary adenocarcinoma, but enteric adenocarcinoma of the lung is a possibility. Breast and ovarian carcinoma are unlikely. Other primary sites are not excluded histologically. There are probably sufficient tumor cells in block A1 (which was not decalcified) for ancillary molecular studies.  Molecular testing for EGFR, ALK, RET and ROS1 all negative.  Started palliative chemo with Alimta/Carboplatin on 03/13/14. Now on single agent Alimta, and on Xgeva for bone metastases.   HISTORY OF PRESENT ILLNESS:  Patient returns for continued oncology evaluation and plan next dose of chemotherapy. States that she is feeling much weaker and fatigued. She does not  feel like taking chemo today. Appetite has been good. States that her lower extremity edema is better. Denies falls or loss of consciousness. Pain fluctuates 0-6/10.   REVIEW OF SYSTEMS:   ROS As in HPI above. In addition, no fever, chills. No new headaches or focal weakness.  No new sore throat or dysphagia. No hemoptysis or chest pain. No dizziness or palpitation. No abdominal pain, constipation, diarrhea, dysuria or hematuria. No new skin rash or bleeding symptoms.  PS ECOG 2.  PAST MEDICAL HISTORY:         Hypertension  Hyperlipidemia  Type 2 diabetes mellitus  Sleep apnea  Goiter  Chronic renal insufficiency  GERD  COPD  Cataracts   PAST SURGICAL HISTORY: Partial hysterectomy  FAMILY HISTORY:  remarkable for diabetes, ovarian cancer (mother), lung cancer (sister).    SOCIAL HISTORY: History  Substance Use Topics  . Smoking status: Former Research scientist (life sciences)  . Smokeless tobacco: Not on file  . Alcohol Use: No  Ex-smoker, quit in 1991, 25-pack-year history. Denies alcohol usage. Tries to be physically active.  Allergies  Allergen Reactions  . Quinapril Hcl Nausea And Vomiting    Current Outpatient Prescriptions  Medication Sig Dispense Refill  . aspirin EC 81 MG tablet Take 81 mg by mouth daily.    . fentaNYL (DURAGESIC) 100 MCG/HR Place 1 patch (100 mcg total) onto the skin every 3 (three) days. 10 patch 0  . furosemide (LASIX) 40 MG tablet Take 40 mg by mouth daily.     Marland Kitchen gabapentin (NEURONTIN) 100 MG capsule Take 100 mg by mouth 3 (three) times daily.    Marland Kitchen glipiZIDE (GLUCOTROL XL) 10 MG 24 hr tablet Take 10 mg by mouth daily.     Marland Kitchen lidocaine-prilocaine (EMLA) cream Apply 1 application topically as needed (with each chemotherapy treatment).     Marland Kitchen  LORazepam (ATIVAN) 0.5 MG tablet Take 0.5 mg by mouth every 8 (eight) hours as needed for anxiety.     Marland Kitchen losartan (COZAAR) 100 MG tablet Take 100 mg by mouth daily.    . metoCLOPramide (REGLAN) 10 MG tablet Take 10 mg by mouth every 8  (eight) hours as needed for nausea.    Marland Kitchen omeprazole (PRILOSEC) 20 MG capsule Take 20 mg by mouth daily.    . promethazine (PHENERGAN) 25 MG tablet Take 25 mg by mouth every 6 (six) hours as needed for nausea or vomiting.    . rosuvastatin (CRESTOR) 10 MG tablet Take 10 mg by mouth daily.    Marland Kitchen spironolactone (ALDACTONE) 25 MG tablet Take 25 mg by mouth daily.    Marland Kitchen amLODipine (NORVASC) 5 MG tablet Take 5 mg by mouth daily.    . baclofen (LIORESAL) 10 MG tablet Take 10 mg by mouth 3 (three) times daily.    . fentaNYL (DURAGESIC - DOSED MCG/HR) 25 MCG/HR patch Place 1 patch (25 mcg total) onto the skin every 3 (three) days. 7 patch 0  . Fluticasone-Salmeterol (ADVAIR) 250-50 MCG/DOSE AEPB Inhale 1 puff into the lungs 2 (two) times daily.    . folic acid (FOLVITE) 1 MG tablet Take 1 mg by mouth daily.    Marland Kitchen oxyCODONE (OXY IR/ROXICODONE) 5 MG immediate release tablet Take 1 - 2 tablets  (5 - 10 mg) orally every 3-4 hours as needed for pain. 90 tablet 0   No current facility-administered medications for this visit.   Facility-Administered Medications Ordered in Other Visits  Medication Dose Route Frequency Provider Last Rate Last Dose  . cyanocobalamin ((VITAMIN B-12)) injection 1,000 mcg  1,000 mcg Intramuscular Q21 days Leia Alf, MD   1,000 mcg at 06/27/14 1608  . cyanocobalamin ((VITAMIN B-12)) injection 1,000 mcg  1,000 mcg Intramuscular Once Leia Alf, MD        OBJECTIVE: Filed Vitals:   08/08/14 1432  BP: 149/65  Pulse: 102  Resp: 22     Body mass index is 35.55 kg/(m^2).    ECOG FS:2 - Symptomatic, <50% confined to bed  GENERAL: Patient is chronically weak-looking, sitting in wheelchair as usual, otherwise alert and oriented and in no acute distress. No icterus. HEENT: EOMs intact. Oral exam negative for thrush or lesions. No cervical lymphadenopathy. CVS: S1S2, regular LUNGS: Bilaterally good air entry, no rhonchi. ABDOMEN: Soft, nontender.    NEURO: grossly nonfocal,  cranial nerves are intact. EXTREMITIES: b/l 1+ pedal edema.  LAB RESULTS: WBC 4.6, Hb 7.1, plts 91, ANC 3.8. Recent Cr 1.29.     Component Value Date/Time   NA 136 08/03/2014 0916   NA 140 06/06/2014 1338   K 3.9 08/03/2014 0916   K 4.1 06/06/2014 1338   CL 101 08/03/2014 0916   CL 104 06/06/2014 1338   CO2 27 08/03/2014 0916   CO2 31 06/06/2014 1338   GLUCOSE 159* 08/03/2014 0916   GLUCOSE 132* 06/06/2014 1338   BUN 25* 08/03/2014 0916   BUN 15 06/06/2014 1338   CREATININE 1.29* 08/03/2014 0916   CREATININE 1.06* 06/06/2014 1338   CALCIUM 6.7* 08/03/2014 0916   CALCIUM 8.6* 06/06/2014 1338   PROT 6.3* 08/03/2014 0916   PROT 6.0* 05/30/2014 1558   ALBUMIN 2.0* 08/03/2014 0916   ALBUMIN 2.3* 05/30/2014 1558   AST 35 08/03/2014 0916   AST 30 05/30/2014 1558   ALT 30 08/03/2014 0916   ALT 18 05/30/2014 1558   ALKPHOS 111 08/03/2014 0916  ALKPHOS 64 05/30/2014 1558   BILITOT 0.7 08/03/2014 0916   GFRNONAA 42* 08/03/2014 0916   GFRNONAA 54* 06/06/2014 1338   GFRAA 48* 08/03/2014 0916   GFRAA >60 06/06/2014 1338            STUDIES: 05/11/14 - CT scan of the chest. IMPRESSION: 1. Minimal regression in size of a dominant nodule in the right upper lobe. Additional scattered bile pulmonary nodular densities are stable. 2. Asymmetrically enlarged left lobe of the thyroid with a dominant nodule, previously interrogated by ultrasound on 01/02/2014. 3. Coronary artery calcification. 4. Left adrenal adenoma.  07/11/14 - CT Chest. IMPRESSION: 1. Dominant perihilar nodule in the right upper lobe is stable to minimally decreased in the interval. 2. Similar appearance of retroesophageal adenopathy. 3. Dominant nodule in left lobe of thyroid gland is unchanged. 4. Aortic atherosclerosis and coronary artery calcifications.  07/31/14 - CT scan abdomen/pelvis. IMPRESSION: Questionable bladder wall thickening, but the bladder is decompressed and difficult to evaluate. Recommend clinical  correlation to exclude cystitis. Small to moderate free fluid in the pelvis and adjacent to the right liver edge. Stable bony metastatic disease.  ASSESSMENT / PLAN:   1. 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 02/06/14 - CT-guided biopsy of lumbar L3 lesion showed METASTATIC ADENOCARCINOMA. Molecular testing for EGFR, ALK, RET and ROS1 all negative. Repeat CT chest from March 31 showed mild decrease in size of dominant lung mass, no new metastasis -   Have reviewed labs from today and d/w patient and family present. Overall performance status is borderline poor and she is feeling more fatigued. She wants to take a small break from chemo, will hold chemo and f/u in 2 weeks with labs, and resume treatment if she is doing better overall. Recent CT chest on 5/31 reported mild decrease in RUL lung nodule and stable retroesophageal adenopathy.   2. Anemia - on ferrous sulfate 325 mg once daily, on folic acid. Continue to monitor and transfuse as indicated (if Hb drops to <7).     3. Bone metatasis - on denosumab (Exgeva), continue once every few weeks.     4. Pain - Under much better control - continue current dose of Fentanyl and Roxanol p.r.n.      5. Lower extremity edema - continue diuretics and monitor.          In between visits, she was advised to call or come to ER in case of progressive symptoms or acute sickness. She is agreeable to this plan   Leia Alf, MD   08/19/2014 12:48 PM

## 2014-08-22 ENCOUNTER — Inpatient Hospital Stay: Payer: Medicare HMO

## 2014-08-22 ENCOUNTER — Other Ambulatory Visit: Payer: Self-pay | Admitting: *Deleted

## 2014-08-22 ENCOUNTER — Inpatient Hospital Stay (HOSPITAL_BASED_OUTPATIENT_CLINIC_OR_DEPARTMENT_OTHER): Payer: Medicare HMO | Admitting: Internal Medicine

## 2014-08-22 ENCOUNTER — Inpatient Hospital Stay: Payer: Medicare HMO | Attending: Internal Medicine

## 2014-08-22 VITALS — BP 112/71 | HR 91 | Temp 98.6°F | Resp 18 | Ht 63.0 in | Wt 202.6 lb

## 2014-08-22 DIAGNOSIS — C7951 Secondary malignant neoplasm of bone: Secondary | ICD-10-CM | POA: Insufficient documentation

## 2014-08-22 DIAGNOSIS — N189 Chronic kidney disease, unspecified: Secondary | ICD-10-CM | POA: Diagnosis not present

## 2014-08-22 DIAGNOSIS — E119 Type 2 diabetes mellitus without complications: Secondary | ICD-10-CM | POA: Diagnosis not present

## 2014-08-22 DIAGNOSIS — D630 Anemia in neoplastic disease: Secondary | ICD-10-CM

## 2014-08-22 DIAGNOSIS — C3490 Malignant neoplasm of unspecified part of unspecified bronchus or lung: Secondary | ICD-10-CM

## 2014-08-22 DIAGNOSIS — D649 Anemia, unspecified: Secondary | ICD-10-CM | POA: Diagnosis not present

## 2014-08-22 DIAGNOSIS — C3411 Malignant neoplasm of upper lobe, right bronchus or lung: Secondary | ICD-10-CM

## 2014-08-22 DIAGNOSIS — Z5111 Encounter for antineoplastic chemotherapy: Secondary | ICD-10-CM | POA: Diagnosis not present

## 2014-08-22 DIAGNOSIS — Z79899 Other long term (current) drug therapy: Secondary | ICD-10-CM | POA: Diagnosis not present

## 2014-08-22 DIAGNOSIS — I129 Hypertensive chronic kidney disease with stage 1 through stage 4 chronic kidney disease, or unspecified chronic kidney disease: Secondary | ICD-10-CM | POA: Diagnosis not present

## 2014-08-22 LAB — CBC WITH DIFFERENTIAL/PLATELET
BASOS ABS: 0 10*3/uL (ref 0–0.1)
Basophils Relative: 0 %
EOS ABS: 0 10*3/uL (ref 0–0.7)
Eosinophils Relative: 0 %
HCT: 18.5 % — ABNORMAL LOW (ref 35.0–47.0)
HEMOGLOBIN: 5.9 g/dL — AB (ref 12.0–16.0)
LYMPHS ABS: 0.4 10*3/uL — AB (ref 1.0–3.6)
LYMPHS PCT: 10 %
MCH: 29.7 pg (ref 26.0–34.0)
MCHC: 31.6 g/dL — ABNORMAL LOW (ref 32.0–36.0)
MCV: 94.1 fL (ref 80.0–100.0)
MONO ABS: 0.6 10*3/uL (ref 0.2–0.9)
Monocytes Relative: 15 %
NEUTROS ABS: 2.7 10*3/uL (ref 1.4–6.5)
Neutrophils Relative %: 75 %
Platelets: 196 10*3/uL (ref 150–440)
RBC: 1.97 MIL/uL — ABNORMAL LOW (ref 3.80–5.20)
RDW: 23.6 % — ABNORMAL HIGH (ref 11.5–14.5)
WBC: 3.7 10*3/uL (ref 3.6–11.0)

## 2014-08-22 LAB — BASIC METABOLIC PANEL
Anion gap: 8 (ref 5–15)
BUN: 23 mg/dL — ABNORMAL HIGH (ref 6–20)
CALCIUM: 6.4 mg/dL — AB (ref 8.9–10.3)
CO2: 29 mmol/L (ref 22–32)
Chloride: 104 mmol/L (ref 101–111)
Creatinine, Ser: 1.73 mg/dL — ABNORMAL HIGH (ref 0.44–1.00)
GFR calc Af Amer: 34 mL/min — ABNORMAL LOW (ref >60)
GFR, EST NON AFRICAN AMERICAN: 29 mL/min — AB (ref >60)
Glucose, Bld: 128 mg/dL — ABNORMAL HIGH (ref 65–99)
POTASSIUM: 3.9 mmol/L (ref 3.5–5.1)
Sodium: 141 mmol/L (ref 135–145)

## 2014-08-22 LAB — IRON AND TIBC
Iron: 36 ug/dL (ref 28–170)
Saturation Ratios: 21 % (ref 10.4–31.8)
TIBC: 171 ug/dL — ABNORMAL LOW (ref 250–450)
UIBC: 135 ug/dL

## 2014-08-22 LAB — DAT, POLYSPECIFIC AHG (ARMC ONLY): Polyspecific AHG test: NEGATIVE

## 2014-08-22 LAB — SAMPLE TO BLOOD BANK

## 2014-08-22 LAB — FOLATE: FOLATE: 28 ng/mL (ref >5.9)

## 2014-08-22 LAB — FERRITIN: Ferritin: 2618 ng/mL — ABNORMAL HIGH (ref 11–307)

## 2014-08-22 LAB — PREPARE RBC (CROSSMATCH)

## 2014-08-22 NOTE — Telephone Encounter (Signed)
Pt here in office and needs refill of aldactone, a refill was approved per dr Ma Hillock and called into her pharmacy and pt notified.

## 2014-08-23 ENCOUNTER — Inpatient Hospital Stay: Payer: Medicare HMO

## 2014-08-23 VITALS — BP 126/79 | HR 92 | Temp 98.1°F | Resp 22

## 2014-08-23 DIAGNOSIS — Z5111 Encounter for antineoplastic chemotherapy: Secondary | ICD-10-CM | POA: Diagnosis not present

## 2014-08-23 DIAGNOSIS — D630 Anemia in neoplastic disease: Secondary | ICD-10-CM

## 2014-08-23 LAB — VITAMIN B12: Vitamin B-12: 911 pg/mL (ref 180–914)

## 2014-08-23 MED ORDER — SODIUM CHLORIDE 0.9 % IV SOLN
INTRAVENOUS | Status: AC
  Administered 2014-08-23: 09:00:00 via INTRAVENOUS
  Filled 2014-08-23: qty 250

## 2014-08-23 MED ORDER — SODIUM CHLORIDE 0.9 % IJ SOLN
3.0000 mL | INTRAMUSCULAR | Status: AC | PRN
  Administered 2014-08-23: 3 mL
  Filled 2014-08-23: qty 10

## 2014-08-23 MED ORDER — ACETAMINOPHEN 325 MG PO TABS
650.0000 mg | ORAL_TABLET | Freq: Once | ORAL | Status: AC
  Administered 2014-08-23: 650 mg via ORAL
  Filled 2014-08-23: qty 2

## 2014-08-23 MED ORDER — HEPARIN SOD (PORK) LOCK FLUSH 100 UNIT/ML IV SOLN
500.0000 [IU] | Freq: Every day | INTRAVENOUS | Status: AC | PRN
  Administered 2014-08-23: 500 [IU]
  Filled 2014-08-23: qty 5

## 2014-08-23 MED ORDER — DIPHENHYDRAMINE HCL 25 MG PO CAPS
25.0000 mg | ORAL_CAPSULE | Freq: Once | ORAL | Status: AC
  Administered 2014-08-23: 25 mg via ORAL
  Filled 2014-08-23: qty 1

## 2014-08-24 LAB — PROTEIN ELECTROPHORESIS, SERUM
A/G Ratio: 0.6 — ABNORMAL LOW (ref 0.7–1.7)
ALBUMIN ELP: 2.1 g/dL — AB (ref 2.9–4.4)
ALPHA-1-GLOBULIN: 0.4 g/dL (ref 0.0–0.4)
Alpha-2-Globulin: 1.2 g/dL — ABNORMAL HIGH (ref 0.4–1.0)
Beta Globulin: 1.1 g/dL (ref 0.7–1.3)
GLOBULIN, TOTAL: 3.7 g/dL (ref 2.2–3.9)
Gamma Globulin: 1 g/dL (ref 0.4–1.8)
TOTAL PROTEIN ELP: 5.8 g/dL — AB (ref 6.0–8.5)

## 2014-08-24 LAB — TYPE AND SCREEN
ABO/RH(D): B NEG
ANTIBODY SCREEN: NEGATIVE
Unit division: 0
Unit division: 0

## 2014-08-24 LAB — HAPTOGLOBIN: Haptoglobin: 590 mg/dL — ABNORMAL HIGH (ref 34–200)

## 2014-08-24 LAB — ERYTHROPOIETIN: Erythropoietin: 72.2 m[IU]/mL — ABNORMAL HIGH (ref 2.6–18.5)

## 2014-08-25 ENCOUNTER — Inpatient Hospital Stay (HOSPITAL_BASED_OUTPATIENT_CLINIC_OR_DEPARTMENT_OTHER): Payer: Medicare HMO | Admitting: Internal Medicine

## 2014-08-25 ENCOUNTER — Inpatient Hospital Stay: Payer: Medicare HMO

## 2014-08-25 ENCOUNTER — Encounter: Payer: Self-pay | Admitting: Internal Medicine

## 2014-08-25 VITALS — BP 114/71 | HR 91 | Temp 98.1°F | Resp 18 | Wt 201.5 lb

## 2014-08-25 DIAGNOSIS — C3491 Malignant neoplasm of unspecified part of right bronchus or lung: Secondary | ICD-10-CM

## 2014-08-25 DIAGNOSIS — Z5111 Encounter for antineoplastic chemotherapy: Secondary | ICD-10-CM | POA: Diagnosis not present

## 2014-08-25 DIAGNOSIS — Z79899 Other long term (current) drug therapy: Secondary | ICD-10-CM | POA: Diagnosis not present

## 2014-08-25 DIAGNOSIS — C3411 Malignant neoplasm of upper lobe, right bronchus or lung: Secondary | ICD-10-CM | POA: Diagnosis not present

## 2014-08-25 DIAGNOSIS — D649 Anemia, unspecified: Secondary | ICD-10-CM | POA: Diagnosis not present

## 2014-08-25 DIAGNOSIS — E119 Type 2 diabetes mellitus without complications: Secondary | ICD-10-CM

## 2014-08-25 DIAGNOSIS — C3490 Malignant neoplasm of unspecified part of unspecified bronchus or lung: Secondary | ICD-10-CM

## 2014-08-25 DIAGNOSIS — D630 Anemia in neoplastic disease: Secondary | ICD-10-CM

## 2014-08-25 DIAGNOSIS — C7951 Secondary malignant neoplasm of bone: Secondary | ICD-10-CM | POA: Diagnosis not present

## 2014-08-25 DIAGNOSIS — I129 Hypertensive chronic kidney disease with stage 1 through stage 4 chronic kidney disease, or unspecified chronic kidney disease: Secondary | ICD-10-CM

## 2014-08-25 DIAGNOSIS — N189 Chronic kidney disease, unspecified: Secondary | ICD-10-CM

## 2014-08-25 LAB — SAMPLE TO BLOOD BANK

## 2014-08-25 LAB — CBC WITH DIFFERENTIAL/PLATELET
BASOS PCT: 0 %
Basophils Absolute: 0 10*3/uL (ref 0–0.1)
EOS ABS: 0 10*3/uL (ref 0–0.7)
Eosinophils Relative: 0 %
HCT: 27 % — ABNORMAL LOW (ref 35.0–47.0)
Hemoglobin: 8.8 g/dL — ABNORMAL LOW (ref 12.0–16.0)
Lymphocytes Relative: 7 %
Lymphs Abs: 0.3 10*3/uL — ABNORMAL LOW (ref 1.0–3.6)
MCH: 29 pg (ref 26.0–34.0)
MCHC: 32.6 g/dL (ref 32.0–36.0)
MCV: 88.9 fL (ref 80.0–100.0)
MONO ABS: 0.6 10*3/uL (ref 0.2–0.9)
Monocytes Relative: 13 %
NEUTROS PCT: 80 %
Neutro Abs: 3.5 10*3/uL (ref 1.4–6.5)
PLATELETS: 166 10*3/uL (ref 150–440)
RBC: 3.04 MIL/uL — ABNORMAL LOW (ref 3.80–5.20)
RDW: 21.5 % — ABNORMAL HIGH (ref 11.5–14.5)
WBC: 4.4 10*3/uL (ref 3.6–11.0)

## 2014-08-25 LAB — CREATININE, SERUM
CREATININE: 1.64 mg/dL — AB (ref 0.44–1.00)
GFR calc non Af Amer: 31 mL/min — ABNORMAL LOW (ref >60)
GFR, EST AFRICAN AMERICAN: 36 mL/min — AB (ref >60)

## 2014-08-25 LAB — CALCIUM: Calcium: 6.4 mg/dL — CL (ref 8.9–10.3)

## 2014-08-25 MED ORDER — SODIUM CHLORIDE 0.9 % IV SOLN
1.0000 g | Freq: Once | INTRAVENOUS | Status: AC
  Administered 2014-08-25: 1 g via INTRAVENOUS
  Filled 2014-08-25: qty 10

## 2014-08-25 MED ORDER — FOLIC ACID 1 MG PO TABS: 1.0000 mg | ORAL_TABLET | Freq: Every day | ORAL | Status: AC

## 2014-08-25 MED ORDER — OXYCODONE HCL 5 MG PO TABS: ORAL_TABLET | ORAL | Status: AC

## 2014-08-25 MED ORDER — SODIUM CHLORIDE 0.9 % IV SOLN
Freq: Once | INTRAVENOUS | Status: AC
  Administered 2014-08-25: 12:00:00 via INTRAVENOUS
  Filled 2014-08-25: qty 4

## 2014-08-25 MED ORDER — SODIUM CHLORIDE 0.9 % IJ SOLN
10.0000 mL | INTRAMUSCULAR | Status: DC | PRN
  Filled 2014-08-25: qty 10

## 2014-08-25 MED ORDER — FENTANYL 100 MCG/HR TD PT72: 100.0000 ug | MEDICATED_PATCH | TRANSDERMAL | Status: AC

## 2014-08-25 MED ORDER — SODIUM CHLORIDE 0.9 % IV SOLN
Freq: Once | INTRAVENOUS | Status: AC
  Administered 2014-08-25: 11:00:00 via INTRAVENOUS
  Filled 2014-08-25: qty 1000

## 2014-08-25 MED ORDER — FENTANYL 25 MCG/HR TD PT72: 25.0000 ug | MEDICATED_PATCH | TRANSDERMAL | Status: AC

## 2014-08-25 MED ORDER — CYANOCOBALAMIN 1000 MCG/ML IJ SOLN
1000.0000 ug | Freq: Once | INTRAMUSCULAR | Status: AC
Start: 2014-08-25 — End: 2014-08-25
  Administered 2014-08-25: 1000 ug via INTRAMUSCULAR
  Filled 2014-08-25: qty 1

## 2014-08-25 MED ORDER — HEPARIN SOD (PORK) LOCK FLUSH 100 UNIT/ML IV SOLN
500.0000 [IU] | Freq: Once | INTRAVENOUS | Status: AC | PRN
  Administered 2014-08-25: 500 [IU]
  Filled 2014-08-25: qty 5

## 2014-08-25 MED ORDER — SODIUM CHLORIDE 0.9 % IV SOLN
400.0000 mg/m2 | Freq: Once | INTRAVENOUS | Status: AC
  Administered 2014-08-25: 800 mg via INTRAVENOUS
  Filled 2014-08-25: qty 28

## 2014-08-29 ENCOUNTER — Emergency Department: Payer: Medicare HMO

## 2014-08-29 ENCOUNTER — Inpatient Hospital Stay
Admission: EM | Admit: 2014-08-29 | Discharge: 2014-09-11 | DRG: 602 | Disposition: E | Payer: Medicare HMO | Attending: Internal Medicine | Admitting: Internal Medicine

## 2014-08-29 ENCOUNTER — Encounter: Payer: Self-pay | Admitting: Emergency Medicine

## 2014-08-29 ENCOUNTER — Inpatient Hospital Stay: Payer: Medicare HMO

## 2014-08-29 DIAGNOSIS — C7951 Secondary malignant neoplasm of bone: Secondary | ICD-10-CM | POA: Diagnosis not present

## 2014-08-29 DIAGNOSIS — Z7982 Long term (current) use of aspirin: Secondary | ICD-10-CM | POA: Diagnosis not present

## 2014-08-29 DIAGNOSIS — Z801 Family history of malignant neoplasm of trachea, bronchus and lung: Secondary | ICD-10-CM

## 2014-08-29 DIAGNOSIS — E43 Unspecified severe protein-calorie malnutrition: Secondary | ICD-10-CM

## 2014-08-29 DIAGNOSIS — Z79899 Other long term (current) drug therapy: Secondary | ICD-10-CM

## 2014-08-29 DIAGNOSIS — I129 Hypertensive chronic kidney disease with stage 1 through stage 4 chronic kidney disease, or unspecified chronic kidney disease: Secondary | ICD-10-CM | POA: Diagnosis not present

## 2014-08-29 DIAGNOSIS — A419 Sepsis, unspecified organism: Secondary | ICD-10-CM | POA: Diagnosis present

## 2014-08-29 DIAGNOSIS — C3491 Malignant neoplasm of unspecified part of right bronchus or lung: Secondary | ICD-10-CM | POA: Diagnosis present

## 2014-08-29 DIAGNOSIS — G9349 Other encephalopathy: Secondary | ICD-10-CM | POA: Diagnosis not present

## 2014-08-29 DIAGNOSIS — Z87891 Personal history of nicotine dependence: Secondary | ICD-10-CM | POA: Diagnosis not present

## 2014-08-29 DIAGNOSIS — R339 Retention of urine, unspecified: Secondary | ICD-10-CM | POA: Diagnosis not present

## 2014-08-29 DIAGNOSIS — G4730 Sleep apnea, unspecified: Secondary | ICD-10-CM | POA: Diagnosis present

## 2014-08-29 DIAGNOSIS — N189 Chronic kidney disease, unspecified: Secondary | ICD-10-CM | POA: Diagnosis not present

## 2014-08-29 DIAGNOSIS — C3411 Malignant neoplasm of upper lobe, right bronchus or lung: Secondary | ICD-10-CM | POA: Diagnosis not present

## 2014-08-29 DIAGNOSIS — G8929 Other chronic pain: Secondary | ICD-10-CM | POA: Diagnosis present

## 2014-08-29 DIAGNOSIS — L03311 Cellulitis of abdominal wall: Secondary | ICD-10-CM | POA: Diagnosis present

## 2014-08-29 DIAGNOSIS — K219 Gastro-esophageal reflux disease without esophagitis: Secondary | ICD-10-CM | POA: Diagnosis present

## 2014-08-29 DIAGNOSIS — L03116 Cellulitis of left lower limb: Secondary | ICD-10-CM | POA: Diagnosis present

## 2014-08-29 DIAGNOSIS — Z8041 Family history of malignant neoplasm of ovary: Secondary | ICD-10-CM | POA: Diagnosis not present

## 2014-08-29 DIAGNOSIS — R5081 Fever presenting with conditions classified elsewhere: Secondary | ICD-10-CM | POA: Diagnosis not present

## 2014-08-29 DIAGNOSIS — D61818 Other pancytopenia: Secondary | ICD-10-CM | POA: Diagnosis present

## 2014-08-29 DIAGNOSIS — Z66 Do not resuscitate: Secondary | ICD-10-CM | POA: Diagnosis not present

## 2014-08-29 DIAGNOSIS — J449 Chronic obstructive pulmonary disease, unspecified: Secondary | ICD-10-CM | POA: Diagnosis present

## 2014-08-29 DIAGNOSIS — Z833 Family history of diabetes mellitus: Secondary | ICD-10-CM

## 2014-08-29 DIAGNOSIS — C3490 Malignant neoplasm of unspecified part of unspecified bronchus or lung: Secondary | ICD-10-CM | POA: Insufficient documentation

## 2014-08-29 DIAGNOSIS — E8809 Other disorders of plasma-protein metabolism, not elsewhere classified: Secondary | ICD-10-CM | POA: Diagnosis present

## 2014-08-29 DIAGNOSIS — L0390 Cellulitis, unspecified: Secondary | ICD-10-CM

## 2014-08-29 DIAGNOSIS — M549 Dorsalgia, unspecified: Secondary | ICD-10-CM | POA: Diagnosis present

## 2014-08-29 DIAGNOSIS — D6959 Other secondary thrombocytopenia: Secondary | ICD-10-CM | POA: Diagnosis present

## 2014-08-29 DIAGNOSIS — L03115 Cellulitis of right lower limb: Secondary | ICD-10-CM | POA: Diagnosis present

## 2014-08-29 DIAGNOSIS — E1121 Type 2 diabetes mellitus with diabetic nephropathy: Secondary | ICD-10-CM | POA: Diagnosis present

## 2014-08-29 DIAGNOSIS — E785 Hyperlipidemia, unspecified: Secondary | ICD-10-CM | POA: Diagnosis present

## 2014-08-29 DIAGNOSIS — L03119 Cellulitis of unspecified part of limb: Secondary | ICD-10-CM | POA: Diagnosis not present

## 2014-08-29 DIAGNOSIS — Z515 Encounter for palliative care: Secondary | ICD-10-CM | POA: Diagnosis not present

## 2014-08-29 DIAGNOSIS — Z5111 Encounter for antineoplastic chemotherapy: Secondary | ICD-10-CM | POA: Diagnosis present

## 2014-08-29 DIAGNOSIS — Z993 Dependence on wheelchair: Secondary | ICD-10-CM

## 2014-08-29 DIAGNOSIS — D649 Anemia, unspecified: Secondary | ICD-10-CM | POA: Diagnosis not present

## 2014-08-29 DIAGNOSIS — R509 Fever, unspecified: Secondary | ICD-10-CM

## 2014-08-29 DIAGNOSIS — E119 Type 2 diabetes mellitus without complications: Secondary | ICD-10-CM | POA: Diagnosis not present

## 2014-08-29 LAB — CBC WITH DIFFERENTIAL/PLATELET
BASOS ABS: 0 10*3/uL (ref 0–0.1)
BASOS ABS: 0 10*3/uL (ref 0–0.1)
Basophils Relative: 0 %
Basophils Relative: 0 %
EOS ABS: 0 10*3/uL (ref 0–0.7)
Eosinophils Absolute: 0 10*3/uL (ref 0–0.7)
Eosinophils Relative: 0 %
Eosinophils Relative: 0 %
HCT: 25.6 % — ABNORMAL LOW (ref 35.0–47.0)
HEMATOCRIT: 25 % — AB (ref 35.0–47.0)
Hemoglobin: 8.2 g/dL — ABNORMAL LOW (ref 12.0–16.0)
Hemoglobin: 8.2 g/dL — ABNORMAL LOW (ref 12.0–16.0)
LYMPHS ABS: 0.1 10*3/uL — AB (ref 1.0–3.6)
LYMPHS PCT: 2 %
LYMPHS PCT: 2 %
Lymphs Abs: 0.2 10*3/uL — ABNORMAL LOW (ref 1.0–3.6)
MCH: 28.8 pg (ref 26.0–34.0)
MCH: 29.1 pg (ref 26.0–34.0)
MCHC: 32 g/dL (ref 32.0–36.0)
MCHC: 32.7 g/dL (ref 32.0–36.0)
MCV: 89.9 fL (ref 80.0–100.0)
MCV: 89.1 fL (ref 80.0–100.0)
MONOS PCT: 1 %
Monocytes Absolute: 0.1 10*3/uL — ABNORMAL LOW (ref 0.2–0.9)
Monocytes Absolute: 0.1 10*3/uL — ABNORMAL LOW (ref 0.2–0.9)
Monocytes Relative: 1 %
NEUTROS ABS: 7.9 10*3/uL — AB (ref 1.4–6.5)
NEUTROS PCT: 97 %
NEUTROS PCT: 97 %
Neutro Abs: 7.5 10*3/uL — ABNORMAL HIGH (ref 1.4–6.5)
PLATELETS: 131 10*3/uL — AB (ref 150–440)
PLATELETS: 121 10*3/uL — AB (ref 150–440)
RBC: 2.85 MIL/uL — ABNORMAL LOW (ref 3.80–5.20)
RBC: 2.8 MIL/uL — ABNORMAL LOW (ref 3.80–5.20)
RDW: 21.1 % — ABNORMAL HIGH (ref 11.5–14.5)
RDW: 20.7 % — ABNORMAL HIGH (ref 11.5–14.5)
WBC: 7.7 10*3/uL (ref 3.6–11.0)
WBC: 8.1 10*3/uL (ref 3.6–11.0)

## 2014-08-29 LAB — URINALYSIS COMPLETE WITH MICROSCOPIC (ARMC ONLY)
BILIRUBIN URINE: NEGATIVE
GLUCOSE, UA: NEGATIVE mg/dL
Hgb urine dipstick: NEGATIVE
Leukocytes, UA: NEGATIVE
Nitrite: NEGATIVE
PH: 6 (ref 5.0–8.0)
PROTEIN: 30 mg/dL — AB
SPECIFIC GRAVITY, URINE: 1.011 (ref 1.005–1.030)

## 2014-08-29 LAB — SAMPLE TO BLOOD BANK

## 2014-08-29 LAB — LACTIC ACID, PLASMA: Lactic Acid, Venous: 0.7 mmol/L (ref 0.5–2.0)

## 2014-08-29 MED ORDER — MOMETASONE FURO-FORMOTEROL FUM 100-5 MCG/ACT IN AERO
2.0000 | INHALATION_SPRAY | Freq: Two times a day (BID) | RESPIRATORY_TRACT | Status: DC
  Administered 2014-08-30: 2 via RESPIRATORY_TRACT
  Filled 2014-08-29: qty 8.8

## 2014-08-29 MED ORDER — ATENOLOL 50 MG PO TABS
50.0000 mg | ORAL_TABLET | Freq: Every day | ORAL | Status: DC
  Administered 2014-08-30: 09:00:00 50 mg via ORAL
  Filled 2014-08-29: qty 1

## 2014-08-29 MED ORDER — VANCOMYCIN HCL IN DEXTROSE 1-5 GM/200ML-% IV SOLN
1000.0000 mg | Freq: Once | INTRAVENOUS | Status: AC
  Administered 2014-08-29: 1000 mg via INTRAVENOUS
  Filled 2014-08-29: qty 200

## 2014-08-29 MED ORDER — FENTANYL 25 MCG/HR TD PT72
25.0000 ug | MEDICATED_PATCH | TRANSDERMAL | Status: DC
  Administered 2014-08-31 – 2014-09-04 (×3): 25 ug via TRANSDERMAL
  Filled 2014-08-29 (×3): qty 1

## 2014-08-29 MED ORDER — GABAPENTIN 100 MG PO CAPS
100.0000 mg | ORAL_CAPSULE | Freq: Three times a day (TID) | ORAL | Status: DC
  Administered 2014-08-30 – 2014-08-31 (×3): 100 mg via ORAL
  Filled 2014-08-29 (×4): qty 1

## 2014-08-29 MED ORDER — HEPARIN SODIUM (PORCINE) 5000 UNIT/ML IJ SOLN
5000.0000 [IU] | Freq: Three times a day (TID) | INTRAMUSCULAR | Status: DC
  Administered 2014-08-30 – 2014-09-01 (×6): 5000 [IU] via SUBCUTANEOUS
  Filled 2014-08-29 (×6): qty 1

## 2014-08-29 MED ORDER — PANTOPRAZOLE SODIUM 40 MG PO TBEC
40.0000 mg | DELAYED_RELEASE_TABLET | Freq: Every day | ORAL | Status: DC
  Administered 2014-08-30: 09:00:00 40 mg via ORAL
  Filled 2014-08-29: qty 1

## 2014-08-29 MED ORDER — ROSUVASTATIN CALCIUM 5 MG PO TABS
10.0000 mg | ORAL_TABLET | Freq: Every day | ORAL | Status: DC
  Administered 2014-08-30: 09:00:00 10 mg via ORAL
  Filled 2014-08-29: qty 2

## 2014-08-29 MED ORDER — FOLIC ACID 1 MG PO TABS
1.0000 mg | ORAL_TABLET | Freq: Every day | ORAL | Status: DC
  Administered 2014-08-30: 09:00:00 1 mg via ORAL
  Filled 2014-08-29: qty 1

## 2014-08-29 MED ORDER — SODIUM CHLORIDE 0.9 % IV BOLUS (SEPSIS)
1000.0000 mL | INTRAVENOUS | Status: AC
  Administered 2014-08-29: 1000 mL via INTRAVENOUS

## 2014-08-29 MED ORDER — VANCOMYCIN HCL 500 MG IV SOLR
500.0000 mg | INTRAVENOUS | Status: DC
  Filled 2014-08-29: qty 500

## 2014-08-29 MED ORDER — VANCOMYCIN HCL IN DEXTROSE 750-5 MG/150ML-% IV SOLN
750.0000 mg | INTRAVENOUS | Status: DC
  Administered 2014-08-30 – 2014-09-01 (×3): 750 mg via INTRAVENOUS
  Filled 2014-08-29 (×4): qty 150

## 2014-08-29 MED ORDER — PIPERACILLIN-TAZOBACTAM 3.375 G IVPB
3.3750 g | Freq: Three times a day (TID) | INTRAVENOUS | Status: DC
  Administered 2014-08-30 – 2014-08-31 (×4): 3.375 g via INTRAVENOUS
  Filled 2014-08-29 (×8): qty 50

## 2014-08-29 MED ORDER — BACLOFEN 10 MG PO TABS
10.0000 mg | ORAL_TABLET | Freq: Three times a day (TID) | ORAL | Status: DC
  Administered 2014-08-30 – 2014-08-31 (×3): 10 mg via ORAL
  Filled 2014-08-29 (×4): qty 1

## 2014-08-29 MED ORDER — FENTANYL 100 MCG/HR TD PT72
100.0000 ug | MEDICATED_PATCH | TRANSDERMAL | Status: DC
  Administered 2014-08-31 – 2014-09-04 (×3): 100 ug via TRANSDERMAL
  Filled 2014-08-29 (×3): qty 1

## 2014-08-29 MED ORDER — OXYCODONE HCL 5 MG PO TABS
5.0000 mg | ORAL_TABLET | ORAL | Status: DC | PRN
  Administered 2014-08-30 (×3): 5 mg via ORAL
  Filled 2014-08-29 (×3): qty 1

## 2014-08-29 MED ORDER — ASPIRIN EC 81 MG PO TBEC
81.0000 mg | DELAYED_RELEASE_TABLET | Freq: Every day | ORAL | Status: DC
  Administered 2014-08-30: 09:00:00 81 mg via ORAL
  Filled 2014-08-29: qty 1

## 2014-08-29 MED ORDER — LORAZEPAM 0.5 MG PO TABS: 0.5000 mg | ORAL_TABLET | Freq: Three times a day (TID) | ORAL | Status: DC | PRN

## 2014-08-29 MED ORDER — INSULIN ASPART 100 UNIT/ML ~~LOC~~ SOLN
0.0000 [IU] | Freq: Three times a day (TID) | SUBCUTANEOUS | Status: DC
  Administered 2014-08-30 (×2): 1 [IU] via SUBCUTANEOUS
  Filled 2014-08-29 (×2): qty 1

## 2014-08-29 MED ORDER — METOCLOPRAMIDE HCL 5 MG PO TABS: 10.0000 mg | ORAL_TABLET | Freq: Three times a day (TID) | ORAL | Status: DC | PRN

## 2014-08-29 MED ORDER — PIPERACILLIN-TAZOBACTAM 3.375 G IVPB 30 MIN
3.3750 g | Freq: Once | INTRAVENOUS | Status: AC
  Administered 2014-08-29: 3.375 g via INTRAVENOUS
  Filled 2014-08-29: qty 50

## 2014-08-29 NOTE — H&P (Signed)
[facility] [place] NAME: Sharon Duran    MR#:  [id number]  DATE OF BIRTH:  10/28/1945  DATE OF ADMISSION:  09/03/2014  PRIMARY CARE PHYSICIAN: Casilda Carls, MD   REQUESTING/REFERRING PHYSICIAN: [place]  CHIEF COMPLAINT:   Chief Complaint  Patient presents with  . Abdominal Pain  . Leg Pain  . Back Pain    HISTORY OF PRESENT ILLNESS: Sharon Duran  is a 69 y.o. female with a known history of diabetes, COPD and lung cancer with metastasis, who had last chemotherapy 5 days ago- has chronic abdominal pain for last few months, and she walks with a walker at baseline. Now for last 1 or 2 days she is more weak and it's even harder for her to walk with walker up to the bathroom. Concerned with this family brought her to emergency room today, that she also had complain of excessive pain in her back her abdomen and her legs. She further describes the pain on her leg to be radiating from her hip to all the way down to her foot, and any movement makes it worse. The pain in the abdomen is on and off and it is chronic. In ER she was noted to have fever up to 10 17F, initial workup was not suggestive of any clear infection but given to hospitalist team for further management.  PAST MEDICAL HISTORY:   Past Medical History  Diagnosis Date  . COPD (chronic obstructive pulmonary disease)   . Diabetes mellitus without complication   . Lung cancer     PAST SURGICAL HISTORY:  Past Surgical History  Procedure Laterality Date  . Portacath placement      SOCIAL HISTORY:  History  Substance Use Topics  . Smoking status: Former Research scientist (life sciences)  . Smokeless tobacco: Not on file  . Alcohol Use: No    FAMILY HISTORY:  Family History  Problem Relation Age of Onset  . Lung cancer Sister     DRUG ALLERGIES:  Allergies  Allergen Reactions  . Quinapril Hcl Nausea And Vomiting    REVIEW OF SYSTEMS:   CONSTITUTIONAL: No complain of fever- found  to have it in ER,  Excessive fatigue or weakness.  EYES: No blurred or double vision.  EARS, NOSE, AND THROAT: No tinnitus or ear pain.  RESPIRATORY: No cough, shortness of breath, wheezing or hemoptysis.  CARDIOVASCULAR: No chest pain, orthopnea, edema.  GASTROINTESTINAL: No nausea, vomiting, diarrhea, but have abdominal pain.  GENITOURINARY: No dysuria, hematuria. Some incontinence at baseline and wears diper. ENDOCRINE: No polyuria, nocturia,  HEMATOLOGY: No anemia, easy bruising or bleeding SKIN: No rash or lesion. MUSCULOSKELETAL: No joint pain or arthritis.   NEUROLOGIC: No tingling, numbness, weakness.  PSYCHIATRY: No anxiety or depression.   MEDICATIONS AT HOME:  Prior to Admission medications   Medication Sig Start Date End Date Taking? Authorizing Provider  amLODipine (NORVASC) 5 MG tablet Take 5 mg by mouth daily.    Historical Provider, MD  aspirin EC 81 MG tablet Take 81 mg by mouth daily.    Historical Provider, MD  atenolol (TENORMIN) 50 MG tablet Take 1 tablet by mouth daily. 08/16/14   Historical Provider, MD  baclofen (LIORESAL) 10 MG tablet Take 10 mg by mouth 3 (three) times daily.    Historical Provider, MD  fentaNYL (DURAGESIC - DOSED MCG/HR) 25 MCG/HR patch Place 1 patch (25 mcg total) onto the skin every 3 (three) days. 08/25/14   Leia Alf, MD  fentaNYL ([place])  100 MCG/HR Place 1 patch (100 mcg total) onto the skin every 3 (three) days. 08/25/14   Leia Alf, MD  Fluticasone-Salmeterol (ADVAIR) 250-50 MCG/DOSE AEPB Inhale 1 puff into the lungs 2 (two) times daily.    Historical Provider, MD  folic acid (FOLVITE) 1 MG tablet Take 1 tablet (1 mg total) by mouth daily. 08/25/14   Leia Alf, MD  furosemide (LASIX) 40 MG tablet Take 40 mg by mouth daily.     Historical Provider, MD  gabapentin (NEURONTIN) 100 MG capsule Take 100 mg by mouth 3 (three) times daily.    Historical Provider, MD  lidocaine-prilocaine (EMLA) cream Apply 1 application topically as  needed (with each chemotherapy treatment).  06/23/14   Historical Provider, MD  LORazepam (ATIVAN) 0.5 MG tablet Take 0.5 mg by mouth every 8 (eight) hours as needed for anxiety.     Historical Provider, MD  losartan (COZAAR) 100 MG tablet Take 100 mg by mouth daily.    Historical Provider, MD  metoCLOPramide (REGLAN) 10 MG tablet Take 10 mg by mouth every 8 (eight) hours as needed for nausea.    Historical Provider, MD  omeprazole (PRILOSEC) 20 MG capsule Take 20 mg by mouth daily.    Historical Provider, MD  oxyCODONE (OXY IR/ROXICODONE) 5 MG immediate release tablet Take 1 - 2 tablets  (5 - 10 mg) orally every 3-4 hours as needed for pain. 08/25/14   Leia Alf, MD  promethazine (PHENERGAN) 25 MG tablet Take 25 mg by mouth every 6 (six) hours as needed for nausea or vomiting.    Historical Provider, MD  rosuvastatin (CRESTOR) 10 MG tablet Take 10 mg by mouth daily.    Historical Provider, MD  spironolactone (ALDACTONE) 25 MG tablet Take 25 mg by mouth daily.    Historical Provider, MD      PHYSICAL EXAMINATION:   VITAL SIGNS: Blood pressure 132/62, pulse 108, temperature 101 F (38.3 C), temperature source Oral, resp. rate 25, height '5\' 3"'$  (1.6 m), weight 86.183 kg (190 lb), SpO2 100 %.  GENERAL:  69 y.o.-year-old obase patient lying in the bed with no acute distress.  EYES: Pupils equal, round, reactive to light and accommodation. No scleral icterus. Extraocular muscles intact.  HEENT: Head atraumatic, normocephalic. Oropharynx and nasopharynx clear.  NECK:  Supple, no jugular venous distention. No thyroid enlargement, no tenderness.  LUNGS: Normal breath sounds bilaterally, no wheezing, rales,rhonchi or crepitation. No use of accessory muscles of respiration.  CARDIOVASCULAR: S1, S2 normal. No murmurs, rubs, or gallops.  ABDOMEN: Soft, mild tender, nondistended. Bowel sounds present. No organomegaly or mass. On lower abdomen- chronic skin changes with redness and warm  area. EXTREMITIES: No pedal edema, cyanosis, or clubbing. Both legs, dark in colour, thin, with cold skin.     On thighs- inner aspect both side- redness and warm present. NEUROLOGIC: Cranial nerves II through XII are intact. Muscle strength 5/5 in all extremities. Sensation intact. Gait not checked.  PSYCHIATRIC: The patient is alert and oriented x 3.  SKIN: No obvious rash, lesion, or ulcer.   LABORATORY PANEL:   CBC  Recent Labs Lab 08/25/14 0900 08/14/2014 1006 08/31/2014 1951  WBC 4.4 7.7 8.1  HGB 8.8* 8.2* 8.2*  HCT 27.0* 25.6* 25.0*  PLT 166 131* 121*  MCV 88.9 89.9 89.1  MCH 29.0 28.8 29.1  MCHC 32.6 32.0 32.7  RDW 21.5* 21.1* 20.7*  LYMPHSABS 0.3* 0.1* 0.2*  MONOABS 0.6 0.1* 0.1*  EOSABS 0.0 0.0 0.0  BASOSABS 0.0 0.0 0.0   ------------------------------------------------------------------------------------------------------------------  Chemistries   Recent Labs Lab 08/25/14 0900 08/23/2014 1951  NA  --  144  K  --  4.2  CL  --  104  CO2  --  28  GLUCOSE  --  152*  BUN  --  26*  CREATININE 1.64* 1.79*  CALCIUM 6.4* 6.0*  AST  --  25  ALT  --  11*  ALKPHOS  --  69  BILITOT  --  1.2   ------------------------------------------------------------------------------------------------------------------ estimated creatinine clearance is 31.3 mL/min (by C-G formula based on Cr of 1.79). ------------------------------------------------------------------------------------------------------------------ No results for input(s): TSH, T4TOTAL, T3FREE, THYROIDAB in the last 72 hours.  Invalid input(s): FREET3   Coagulation profile No results for input(s): INR, PROTIME in the last 168 hours. ------------------------------------------------------------------------------------------------------------------- No results for input(s): DDIMER in the last 72  hours. -------------------------------------------------------------------------------------------------------------------  Cardiac Enzymes No results for input(s): CKMB, TROPONINI, MYOGLOBIN in the last 168 hours.  Invalid input(s): CK ------------------------------------------------------------------------------------------------------------------ Invalid input(s): POCBNP  ---------------------------------------------------------------------------------------------------------------  Urinalysis    Component Value Date/Time   COLORURINE AMBER* 07/31/2014 1625   COLORURINE Yellow 03/17/2014 1105   APPEARANCEUR CLOUDY* 07/31/2014 1625   APPEARANCEUR Hazy 03/17/2014 1105   LABSPEC 1.019 07/31/2014 1625   LABSPEC 1.012 03/17/2014 1105   PHURINE 5.0 07/31/2014 1625   PHURINE 7.0 03/17/2014 1105   GLUCOSEU NEGATIVE 07/31/2014 1625   GLUCOSEU Negative 03/17/2014 1105   HGBUR 3+* 07/31/2014 1625   HGBUR 2+ 03/17/2014 1105   BILIRUBINUR NEGATIVE 07/31/2014 1625   BILIRUBINUR Negative 03/17/2014 1105   KETONESUR NEGATIVE 07/31/2014 1625   KETONESUR Negative 03/17/2014 1105   PROTEINUR 30* 07/31/2014 1625   PROTEINUR Negative 03/17/2014 1105   NITRITE NEGATIVE 07/31/2014 1625   NITRITE Negative 03/17/2014 1105   LEUKOCYTESUR 2+* 07/31/2014 1625   LEUKOCYTESUR 3+ 03/17/2014 1105     RADIOLOGY: US Venous Img Lower Unilateral Left  09/03/2014   CLINICAL DATA:  Left leg PAIN, edema. Lung carcinoma with metastases.  EXAM: LEFT LOWER EXTREMITY VENOUS DOPPLER ULTRASOUND  TECHNIQUE: Gray-scale sonography with compression, as well as color and duplex ultrasound, were performed to evaluate the deep venous system from the level of the common femoral vein through the popliteal and proximal calf veins.  COMPARISON:  04/03/2014  FINDINGS: Normal compressibility of the common femoral, superficial femoral, and popliteal veins, as well as the proximal calf veins. No filling defects to suggest DVT on  grayscale or color Doppler imaging. Doppler waveforms show normal direction of venous flow, normal respiratory phasicity and response to augmentation. Survey views of the contralateral common femoral vein are unremarkable.  IMPRESSION: 1. No evidence of lower extremity deep vein thrombosis, LEFT.   Electronically Signed   By: Lucrezia Europe M.D.   On: 08/25/2014 20:31   Dg Chest Port 1 View  09/01/2014   CLINICAL DATA:  Pt here for abdominal pain, back pain and leg pain. Pt vomiting in triage room. Pt last chemo tx was Friday morning, pt being treated for bone and lung cancer. Pt with fever. H/o COPD  EXAM: PORTABLE CHEST - 1 VIEW  COMPARISON:  CT 07/11/2014 and earlier studies  FINDINGS: Right IJ port catheter to the low SVC. Lungs clear. Stable right hilar fullness. Heart size upper limits normal for technique. Atheromatous aortic arch. No pneumothorax. No effusion. Spondylitic changes in the mid and lower thoracic spine.  IMPRESSION: No acute cardiopulmonary disease.   Electronically Signed   By: Lucrezia Europe M.D.   On: 08/14/2014 20:15    IMPRESSION AND PLAN:  * Fever  Likely cellulitis, on lower abdominal wall and upper thighs.  We will give vancomycin and Zosyn IV for now.   Cultures collected by ER, need to check urinalysis.  * Lung cancer with metastasis  On chemotherapy, last chemotherapy was 5 days ago  In terms of fever- I will call oncology consult.  * Diabetes  We will give sliding scale coverage.  * Chronic renal failure  Monitor  * Hypocalcemia  Have hypoalbuminemia also, she will need dietary consult.  * COPD  No active wheezing, continue with inhalers.  All the records are reviewed and case discussed with ED provider. Management plans discussed with the patient, family and they are in agreement.  CODE STATUS: Full  Advance Directive Documentation        Most Recent Value   Type of Advance Directive  Healthcare Power of Attorney   Pre-existing out of facility DNR order  (yellow form or pink MOST form)     "MOST" Form in Place?         TOTAL TIME TAKING CARE OF THIS PATIENT: 50 minutes.  More than 50% of the visit was spent in counseling/coordination of care: YES  Discussed with daughter and son in room.  Vaughan Basta M.D on 08/27/2014   Between 7am to 6pm - Pager - [phone]  After 6pm go to [email or web address] - password [email or web address]  Office  [phone]  CC: Primary care physician; [facility], MD

## 2014-08-29 NOTE — Progress Notes (Signed)
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   01/31/14 - PET scan. IMPRESSION:  2.6 cm nodule in the medial right upper lobe, compatible with primary bronchogenic neoplasm. Multifocal osseous metastases. 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 Enlarged left thyroid gland, non FDG avid, suggesting goiter.  02/06/14 - CT-guided biopsy of lumbar L3 lesion - METASTATIC ADENOCARCINOMA. Comment: The bone sample contains extensive metastatic adenocarcinoma with cribriform/glandular architecture and mucin production. Immunohistochemistry was performed in an attempt to identify the site of origin. The neoplastic cells are strongly positive for villin and weakly positive for GATA3. The neoplastic cells are negative for TTF-1 (stain was repeated), Napsin A, PAX8, CDX2, ER, GCDFP-15, and mammaglobin. Controls stained appropriately. The staining pattern is not typical for a lung primary adenocarcinoma, but enteric adenocarcinoma of the lung is a possibility. Breast and ovarian carcinoma are unlikely. Other primary sites are not excluded histologically. There are probably sufficient tumor cells in block A1 (which was not decalcified) for ancillary molecular studies.  Molecular testing for EGFR, ALK, RET and ROS1 all negative.  Started palliative chemo with Alimta/Carboplatin on 03/13/14. Now on single agent Alimta, and on Xgeva for bone metastases.   HISTORY OF PRESENT ILLNESS:  Patient returns for continued oncology evaluation, Hb had drifted down to 5.9 and she got 2 units of PRBC tx. Hb better today at 8.8. Denies bleeding  symptoms. She wants to resume chemo today. No fever or chills. States that she continues to have significant generalized weakness and fatigue and is doing minimal ambulation at home. Appetite is steady. Denies falls or loss of consciousness.    REVIEW OF SYSTEMS:   ROS As in HPI above. In addition, no fevers. No new headaches or focal weakness.  No new sore throat or dysphagia. No hemoptysis or chest pain. No dizziness or palpitation. No abdominal pain, diarrhea. No dysuria or hematuria. Chronic skin discoloration or both legs. PS ECOG 3.  PAST MEDICAL HISTORY:         Hypertension  Hyperlipidemia  Type 2 diabetes mellitus  Sleep apnea  Goiter  Chronic renal insufficiency  GERD  COPD  Cataracts   PAST SURGICAL HISTORY: Partial hysterectomy  FAMILY HISTORY:  remarkable for diabetes, ovarian cancer (mother), lung cancer (sister).    SOCIAL HISTORY: History  Substance Use Topics  . Smoking status: Former Research scientist (life sciences)  . Smokeless tobacco: Not on file  . Alcohol Use: No  Ex-smoker, quit in 1991, 25-pack-year history. Denies alcohol usage. Tries to be physically active.  Allergies  Allergen Reactions  . Quinapril Hcl Nausea And Vomiting    No current facility-administered medications for this visit.   No current outpatient prescriptions on file.   Facility-Administered Medications Ordered in Other Visits  Medication Dose Route Frequency Provider Last Rate Last Dose  . 0.9 %  sodium chloride infusion   Intravenous Continuous Leia Alf, MD   Stopped at 08/23/14 1320  . [START ON 08/30/2014] aspirin EC tablet 81 mg  81 mg Oral Daily Vaughan Basta, MD      . Derrill Memo ON 08/30/2014] atenolol (TENORMIN) tablet 50 mg  50 mg Oral Daily Vaughan Basta, MD      .  baclofen (LIORESAL) tablet 10 mg  10 mg Oral TID Vaughan Basta, MD      . cyanocobalamin ((VITAMIN B-12)) injection 1,000 mcg  1,000 mcg Intramuscular Q21 days Leia Alf, MD   1,000 mcg at 06/27/14 1608    . cyanocobalamin ((VITAMIN B-12)) injection 1,000 mcg  1,000 mcg Intramuscular Once Leia Alf, MD      . fentaNYL ([place] - dosed mcg/hr) 100 mcg  100 mcg Transdermal Q72H Vaughan Basta, MD      . fentaNYL ([place] - dosed mcg/hr) patch 25 mcg  25 mcg Transdermal Q72H Vaughan Basta, MD      . Derrill Memo ON 4/76/5465] folic acid (FOLVITE) tablet 1 mg  1 mg Oral Daily Vaughan Basta, MD      . gabapentin (NEURONTIN) capsule 100 mg  100 mg Oral TID Vaughan Basta, MD      . heparin injection 5,000 Units  5,000 Units Subcutaneous 3 times per day Vaughan Basta, MD      . Derrill Memo ON 08/30/2014] insulin aspart (novoLOG) injection 0-9 Units  0-9 Units Subcutaneous TID WC Vaughan Basta, MD      . LORazepam (ATIVAN) tablet 0.5 mg  0.5 mg Oral Q8H PRN Vaughan Basta, MD      . metoCLOPramide (REGLAN) tablet 10 mg  10 mg Oral Q8H PRN Vaughan Basta, MD      . mometasone-formoterol (DULERA) 100-5 MCG/ACT inhaler 2 puff  2 puff Inhalation BID Vaughan Basta, MD      . oxyCODONE (Oxy IR/ROXICODONE) immediate release tablet 5 mg  5 mg Oral Q4H PRN Vaughan Basta, MD      . Derrill Memo ON 08/30/2014] pantoprazole (PROTONIX) EC tablet 40 mg  40 mg Oral Daily Vaughan Basta, MD      . Derrill Memo ON 08/30/2014] piperacillin-tazobactam (ZOSYN) IVPB 3.375 g  3.375 g Intravenous Q8H Vaughan Basta, MD      . Derrill Memo ON 08/30/2014] rosuvastatin (CRESTOR) tablet 10 mg  10 mg Oral Daily Vaughan Basta, MD      . Derrill Memo ON 08/30/2014] vancomycin (VANCOCIN) IVPB 750 mg/150 ml premix  750 mg Intravenous Q24H Vaughan Basta, MD        OBJECTIVE: Filed Vitals:   08/25/14 0923  BP: 114/71  Pulse: 91  Temp: 98.1 F (36.7 C)  Resp: 18     Body mass index is 35.7 kg/(m^2).    ECOG FS:3 - Symptomatic, >50% confined to bed  GENERAL: patient is chronically weak-looking, sitting in wheelchair as usual, otherwise alert and oriented and  in no acute distress. No icterus. Pallor present. HEENT: EOMs intact. Oral exam negative for thrush or lesions.  CVS: S1S2, regular LUNGS: Bilaterally good air entry, no rhonchi. ABDOMEN: Soft, nontender.    EXTREMITIES: pedal edema much better.  LAB RESULTS: Hemoglobin 8.8     Component Value Date/Time   NA 144 08/30/2014 1951   NA 140 06/06/2014 1338   K 4.2 08/26/2014 1951   K 4.1 06/06/2014 1338   CL 104 08/28/2014 1951   CL 104 06/06/2014 1338   CO2 28 09/09/2014 1951   CO2 31 06/06/2014 1338   GLUCOSE 152* 08/12/2014 1951   GLUCOSE 132* 06/06/2014 1338   BUN 26* 08/22/2014 1951   BUN 15 06/06/2014 1338   CREATININE 1.79* 09/01/2014 1951   CREATININE 1.06* 06/06/2014 1338   CALCIUM 6.0* 08/24/2014 1951   CALCIUM 8.6* 06/06/2014 1338   PROT 6.1* 08/22/2014 1951   PROT 6.0* 05/30/2014 1558   ALBUMIN 2.1* 08/25/2014 1951   ALBUMIN  2.3* 05/30/2014 1558   AST 25 09/01/2014 1951   AST 30 05/30/2014 1558   ALT 11* 08/15/2014 1951   ALT 18 05/30/2014 1558   ALKPHOS 69 09/10/2014 1951   ALKPHOS 64 05/30/2014 1558   BILITOT 1.2 09/08/2014 1951   BILITOT 0.5 05/30/2014 1558   GFRNONAA 28* 08/31/2014 1951   GFRNONAA 54* 06/06/2014 1338   GFRAA 32* 09/03/2014 1951   GFRAA >60 06/06/2014 1338            STUDIES: 05/11/14 - CT scan of the chest. IMPRESSION: 1. Minimal regression in size of a dominant nodule in the right upper lobe. Additional scattered bile pulmonary nodular densities are stable. 2. Asymmetrically enlarged left lobe of the thyroid with a dominant nodule, previously interrogated by ultrasound on 01/02/2014. 3. Coronary artery calcification. 4. Left adrenal adenoma.  07/11/14 - CT Chest. IMPRESSION: 1. Dominant perihilar nodule in the right upper lobe is stable to minimally decreased in the interval. 2. Similar appearance of retroesophageal adenopathy. 3. Dominant nodule in left lobe of thyroid gland is unchanged. 4. Aortic atherosclerosis and coronary artery  calcifications.  07/31/14 - CT scan abdomen/pelvis. IMPRESSION: Questionable bladder wall thickening, but the bladder is decompressed and difficult to evaluate. Recommend clinical correlation to exclude cystitis. Small to moderate free fluid in the pelvis and adjacent to the right liver edge. Stable bony metastatic disease.  ASSESSMENT / PLAN:   1. Anemia - No evidence of iron, B12 or folate deficiency. Etiology could be chemotherapy-related anemia along with component of anemia of chronic disease (renal insufficiency) and anemia of cancer. Hemoglobin better at 8.8. Stool OB is negative. Will monitor and  transfuse if Hb < 7 or if progressive anemia symptoms.  2. 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 02/06/14 - CT-guided biopsy of lumbar L3 lesion showed METASTATIC ADENOCARCINOMA. Molecular testing for EGFR, ALK, RET and ROS1 all negative. Repeat CT chest from March 31 showed mild decrease in size of dominant lung mass, no new metastasis -  patien feels better and wants to resume chemo, given poor PS and severe anemia will restart Alimta at lower dose of 400 mg/m2 IV. Monitor weekly labs and f/u at 3 weeks to plan next treatment.  3. Bone metatasis - on denosumab (Exgeva), continue once every few weeks.     4. Pain - Under much better control - continue current dose of Fentanyl and Roxanol p.r.n.      5. Lower extremity edema - significantly better, follows with vascular clinic           In between visits, she was advised to call or come to ER in case of progressive symptoms or acute sickness. She is agreeable to this plan   Leia Alf, MD   09/08/2014 11:19 PM

## 2014-08-29 NOTE — ED Notes (Signed)
Pt at  Chemo today c/o pain in back  And feet.

## 2014-08-29 NOTE — Progress Notes (Signed)
ANTIBIOTIC CONSULT NOTE - INITIAL  Pharmacy Consult for Vancomycin and Zosyn Indication: Cellulitis/r/o sepsis  Allergies  Allergen Reactions  . Quinapril Hcl Nausea And Vomiting    Patient Measurements: Height: '5\' 3"'$  (160 cm) Weight: 190 lb (86.183 kg) IBW/kg (Calculated) : 52.4 Adjusted Body Weight: 65.9 kg  Vital Signs: Temp: 100.9 F (38.3 C) (07/19 2204) Temp Source: Oral (07/19 2204) BP: 127/73 mmHg (07/19 2204) Pulse Rate: 99 (07/19 2204) Intake/Output from previous day:   Intake/Output from this shift:    Labs:  Recent Labs  08/14/2014 1006 08/27/2014 1951  WBC 7.7 8.1  HGB 8.2* 8.2*  PLT 131* 121*  CREATININE  --  1.79*   Estimated Creatinine Clearance: 31.3 mL/min (by C-G formula based on Cr of 1.79). No results for input(s): VANCOTROUGH, VANCOPEAK, VANCORANDOM, GENTTROUGH, GENTPEAK, GENTRANDOM, TOBRATROUGH, TOBRAPEAK, TOBRARND, AMIKACINPEAK, AMIKACINTROU, AMIKACIN in the last 72 hours.   Microbiology: No results found for this or any previous visit (from the past 720 hour(s)).  Medical History: Past Medical History  Diagnosis Date  . COPD (chronic obstructive pulmonary disease)   . Diabetes mellitus without complication   . Lung cancer     Medications:  Scheduled:  . [START ON 08/30/2014] insulin aspart  0-9 Units Subcutaneous TID WC  . [START ON 08/30/2014] vancomycin  750 mg Intravenous Q24H   Infusions:  . [START ON 08/30/2014] piperacillin-tazobactam (ZOSYN)  IV    . sodium chloride Stopped (08/19/2014 2121)   PRN:   Assessment: 69 y/o F on chemotherapy admitted with fever to begin abx for possible cellulitis r/o sepsis.   Goal of Therapy:  Vancomycin trough level 15-20 mcg/ml  Plan:  1. Will target vancomycin trough of 15-20 until there is no longer concern for sepsis. Vancomycin 1000 mg iv once given in ED. Will order vancomycin 750 mg iv q 24 hours and check a trough with the 4th dose.   2. Zosyn 3.375 g once over 30 minutes given in  ED. Will order Zosyn 3.375 g EI q 8 hours to begin 6 hours after initial Zosyn dose.   Will f/u renal function and culture results.   Sharon Duran 08/27/2014,10:06 PM

## 2014-08-29 NOTE — Plan of Care (Addendum)
Problem: Phase III Progression Outcomes Goal: Pain controlled on oral analgesia Individualization of care Likes to be called Harriet. Lives at home with daughter. Has history of lung ca, last chemo was 7/15. Has history of diabetes, COPD and hypertension, controlled by medications.

## 2014-08-29 NOTE — ED Notes (Signed)
Pt here for abdominal pain, back pain and leg pain. Pt vomiting in triage room. Pt last chemo tx was Friday morning, pt being treated for bone and lung cancer. Pt with fever in triage room.

## 2014-08-29 NOTE — Progress Notes (Signed)
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   01/31/14 - PET scan. IMPRESSION:  2.6 cm nodule in the medial right upper lobe, compatible with primary bronchogenic neoplasm. Multifocal osseous metastases. 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 Enlarged left thyroid gland, non FDG avid, suggesting goiter.  02/06/14 - CT-guided biopsy of lumbar L3 lesion - METASTATIC ADENOCARCINOMA. Comment: The bone sample contains extensive metastatic adenocarcinoma with cribriform/glandular architecture and mucin production. Immunohistochemistry was performed in an attempt to identify the site of origin. The neoplastic cells are strongly positive for villin and weakly positive for GATA3. The neoplastic cells are negative for TTF-1 (stain was repeated), Napsin A, PAX8, CDX2, ER, GCDFP-15, and mammaglobin. Controls stained appropriately. The staining pattern is not typical for a lung primary adenocarcinoma, but enteric adenocarcinoma of the lung is a possibility. Breast and ovarian carcinoma are unlikely. Other primary sites are not excluded histologically. There are probably sufficient tumor cells in block A1 (which was not decalcified) for ancillary molecular studies.  Molecular testing for EGFR, ALK, RET and ROS1 all negative.  Started palliative chemo with Alimta/Carboplatin on 03/13/14. Now on single agent Alimta, and on Xgeva for bone metastases.   HISTORY OF PRESENT ILLNESS:  Patient returns for continued oncology evaluation. Labs today shows that hemoglobin has drifted down to 5.9. Patient states that she continues to have  significant generalized weakness and fatigue and is doing minimal ambulation at home, she has not noticed much difference with this but were definitely like to have more energy. She denies any obvious bleeding symptoms including bright red bloody stools, melena, hematuria or other. Appetite is steady. States that her lower extremity edema is better. Denies falls or loss of consciousness. Pain fluctuates 0-6/10.   REVIEW OF SYSTEMS:   ROS As in HPI above. In addition, no fever, chills. No new headaches or focal weakness.  No new sore throat or dysphagia. No hemoptysis or chest pain. No dizziness or palpitation. No abdominal pain, constipation, diarrhea, dysuria or hematuria. Chronic skin discoloration or both legs. PS ECOG 3.  PAST MEDICAL HISTORY:         Hypertension  Hyperlipidemia  Type 2 diabetes mellitus  Sleep apnea  Goiter  Chronic renal insufficiency  GERD  COPD  Cataracts   PAST SURGICAL HISTORY: Partial hysterectomy  FAMILY HISTORY:  remarkable for diabetes, ovarian cancer (mother), lung cancer (sister).    SOCIAL HISTORY: History  Substance Use Topics  . Smoking status: Former Research scientist (life sciences)  . Smokeless tobacco: Not on file  . Alcohol Use: No  Ex-smoker, quit in 1991, 25-pack-year history. Denies alcohol usage. Tries to be physically active.  Allergies  Allergen Reactions  . Quinapril Hcl Nausea And Vomiting    Current Outpatient Prescriptions  Medication Sig Dispense Refill  . amLODipine (NORVASC) 5 MG tablet Take 5 mg by mouth daily.    Marland Kitchen aspirin EC 81 MG tablet Take 81 mg by mouth daily.    . baclofen (LIORESAL) 10 MG tablet Take 10 mg by mouth 3 (three) times daily.    . Fluticasone-Salmeterol (ADVAIR) 250-50 MCG/DOSE AEPB Inhale 1 puff into the lungs 2 (two) times daily.    . furosemide (LASIX)  40 MG tablet Take 40 mg by mouth daily.     Marland Kitchen gabapentin (NEURONTIN) 100 MG capsule Take 100 mg by mouth 3 (three) times daily.    Marland Kitchen lidocaine-prilocaine (EMLA) cream Apply  1 application topically as needed (with each chemotherapy treatment).     . LORazepam (ATIVAN) 0.5 MG tablet Take 0.5 mg by mouth every 8 (eight) hours as needed for anxiety.     Marland Kitchen losartan (COZAAR) 100 MG tablet Take 100 mg by mouth daily.    . metoCLOPramide (REGLAN) 10 MG tablet Take 10 mg by mouth every 8 (eight) hours as needed for nausea.    Marland Kitchen omeprazole (PRILOSEC) 20 MG capsule Take 20 mg by mouth daily.    . promethazine (PHENERGAN) 25 MG tablet Take 25 mg by mouth every 6 (six) hours as needed for nausea or vomiting.    . rosuvastatin (CRESTOR) 10 MG tablet Take 10 mg by mouth daily.    Marland Kitchen spironolactone (ALDACTONE) 25 MG tablet Take 25 mg by mouth daily.    Marland Kitchen atenolol (TENORMIN) 50 MG tablet Take 1 tablet by mouth daily.    . fentaNYL (DURAGESIC - DOSED MCG/HR) 25 MCG/HR patch Place 1 patch (25 mcg total) onto the skin every 3 (three) days. 10 patch 0  . fentaNYL (DURAGESIC) 100 MCG/HR Place 1 patch (100 mcg total) onto the skin every 3 (three) days. 10 patch 0  . folic acid (FOLVITE) 1 MG tablet Take 1 tablet (1 mg total) by mouth daily. 90 tablet 3  . oxyCODONE (OXY IR/ROXICODONE) 5 MG immediate release tablet Take 1 - 2 tablets  (5 - 10 mg) orally every 3-4 hours as needed for pain. 90 tablet 0   No current facility-administered medications for this visit.   Facility-Administered Medications Ordered in Other Visits  Medication Dose Route Frequency Provider Last Rate Last Dose  . 0.9 %  sodium chloride infusion   Intravenous Continuous Leia Alf, MD   Stopped at 08/23/14 1320  . cyanocobalamin ((VITAMIN B-12)) injection 1,000 mcg  1,000 mcg Intramuscular Q21 days Leia Alf, MD   1,000 mcg at 06/27/14 1608  . cyanocobalamin ((VITAMIN B-12)) injection 1,000 mcg  1,000 mcg Intramuscular Once Leia Alf, MD        OBJECTIVE: Filed Vitals:   08/22/14 1505  BP: 112/71  Pulse: 91  Temp: 98.6 F (37 C)  Resp: 18     Body mass index is 35.9 kg/(m^2).    ECOG FS:3 -  Symptomatic, >50% confined to bed  GENERAL: Chronically weak-looking, sitting in wheelchair as usual, otherwise alert and oriented and in no acute distress. No icterus. Bilateral 2+. HEENT: EOMs intact. Oral exam negative for thrush or lesions. No cervical lymphadenopathy. CVS: S1S2, regular LUNGS: Bilaterally good air entry, no rhonchi. ABDOMEN: Soft, nontender.    NEURO: grossly nonfocal, cranial nerves are intact. EXTREMITIES: pedal edema much better.  LAB RESULTS: Hemoglobin 5.9, platelets 196, WBC 3700, 75% neutrophils    Component Value Date/Time   NA 141 08/22/2014 1411   NA 140 06/06/2014 1338   K 3.9 08/22/2014 1411   K 4.1 06/06/2014 1338   CL 104 08/22/2014 1411   CL 104 06/06/2014 1338   CO2 29 08/22/2014 1411   CO2 31 06/06/2014 1338   GLUCOSE 128* 08/22/2014 1411   GLUCOSE 132* 06/06/2014 1338   BUN 23* 08/22/2014 1411   BUN 15 06/06/2014 1338   CREATININE 1.64* 08/25/2014 0900   CREATININE 1.06* 06/06/2014 1338   CALCIUM 6.4* 08/25/2014  0900   CALCIUM 8.6* 06/06/2014 1338   PROT 6.3* 08/03/2014 0916   PROT 6.0* 05/30/2014 1558   ALBUMIN 2.0* 08/03/2014 0916   ALBUMIN 2.3* 05/30/2014 1558   AST 35 08/03/2014 0916   AST 30 05/30/2014 1558   ALT 30 08/03/2014 0916   ALT 18 05/30/2014 1558   ALKPHOS 111 08/03/2014 0916   ALKPHOS 64 05/30/2014 1558   BILITOT 0.7 08/03/2014 0916   BILITOT 0.5 05/30/2014 1558   GFRNONAA 31* 08/25/2014 0900   GFRNONAA 54* 06/06/2014 1338   GFRAA 36* 08/25/2014 0900   GFRAA >60 06/06/2014 1338            STUDIES: 05/11/14 - CT scan of the chest. IMPRESSION: 1. Minimal regression in size of a dominant nodule in the right upper lobe. Additional scattered bile pulmonary nodular densities are stable. 2. Asymmetrically enlarged left lobe of the thyroid with a dominant nodule, previously interrogated by ultrasound on 01/02/2014. 3. Coronary artery calcification. 4. Left adrenal adenoma.  07/11/14 - CT Chest. IMPRESSION: 1.  Dominant perihilar nodule in the right upper lobe is stable to minimally decreased in the interval. 2. Similar appearance of retroesophageal adenopathy. 3. Dominant nodule in left lobe of thyroid gland is unchanged. 4. Aortic atherosclerosis and coronary artery calcifications.  07/31/14 - CT scan abdomen/pelvis. IMPRESSION: Questionable bladder wall thickening, but the bladder is decompressed and difficult to evaluate. Recommend clinical correlation to exclude cystitis. Small to moderate free fluid in the pelvis and adjacent to the right liver edge. Stable bony metastatic disease.  ASSESSMENT / PLAN:   1. Anemia - progressive, hemoglobin down to 5.9. Etiology could still be chemotherapy-related anemia along with component of anemia of chronic disease (renal insufficiency) and anemia of cancer. Will also get other workup including iron studies, B-12, folate, SPEP, DAT test to rule out other etiologies. We will get stool Hemoccults to rule out occult GI blood loss. Given similar symptoms, will transfuse 2 units packed red blood cell tomorrow as outpatient, patient is agreeable to this. 2. 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 02/06/14 - CT-guided biopsy of lumbar L3 lesion showed METASTATIC ADENOCARCINOMA. Molecular testing for EGFR, ALK, RET and ROS1 all negative. Repeat CT chest from March 31 showed mild decrease in size of dominant lung mass, no new metastasis -   given ongoing severe anemia and weakness, we will hold off on chemotherapy today and reevaluate next week and make further plan of management.     3. Bone metatasis - on denosumab (Exgeva), continue once every few weeks.     4. Pain - Under much better control - continue current dose of  Fentanyl and Roxanol p.r.n.      5. Lower extremity edema - significantly better, follows with vascular clinic           In between visits, she was advised to call or come to ER in case of progressive symptoms or acute sickness. She is agreeable to this plan   Leia Alf, MD   09/04/2014 3:48 PM

## 2014-08-29 NOTE — ED Provider Notes (Signed)
[facility] Emergency Department Provider Note  ____________________________________________  Time seen: Approximately 7:20 PM  I have reviewed the triage vital signs and the nursing notes.   HISTORY  Chief Complaint Abdominal Pain; Leg Pain; and Back Pain    HPI Sharon Duran is a 69 y.o. female with a history of COPD and stage IV lung cancer on chemotherapy who presents today with abdominal pain and left leg swelling. She says the abdominal pain is chronic as no ongoing for months. She says that the left lower extremity pain is new starting over the last 3 days. She has associated swelling in this leg as well. She has been using glucose tablet control the swelling in her calves but says that she has not had swelling of her thighs. She is also reporting pain to the left thigh. She says that she has a skin discoloration to the right thigh which is been encroaching on her stomach for the past several months as well. She feels that this is likely due to the radiation treatments. She says that she did not know that she had a fever when she came in today. She said that her last chemotherapy treatment was this past Friday. However, she was in the [facility] this morning for blood draw because of recent transfusions. She says that she been made anemic from the chemotherapy.   Past Medical History  Diagnosis Date  . COPD (chronic obstructive pulmonary disease)   . Diabetes mellitus without complication   . Lung cancer     Patient Active Problem List   Diagnosis Date Noted  . Non-small cell lung cancer 06/27/2014  . COPD, moderate 09/23/2013  . Obstructive apnea 09/23/2013    Past Surgical History  Procedure Laterality Date  . Portacath placement      Current Outpatient Rx  Name  Route  Sig  Dispense  Refill  . amLODipine (NORVASC) 5 MG tablet   Oral   Take 5 mg by mouth daily.         Marland Kitchen aspirin EC 81 MG tablet   Oral   Take 81 mg by mouth  daily.         Marland Kitchen atenolol (TENORMIN) 50 MG tablet   Oral   Take 1 tablet by mouth daily.         . baclofen (LIORESAL) 10 MG tablet   Oral   Take 10 mg by mouth 3 (three) times daily.         . fentaNYL (DURAGESIC - DOSED MCG/HR) 25 MCG/HR patch   Transdermal   Place 1 patch (25 mcg total) onto the skin every 3 (three) days.   10 patch   0     Apply this in addition to 100 mcg/hr patch, total  ...   . fentaNYL (DURAGESIC) 100 MCG/HR   Transdermal   Place 1 patch (100 mcg total) onto the skin every 3 (three) days.   10 patch   0     Apply this in addition to fentanyl 25 mcg/hour pat ...   . Fluticasone-Salmeterol (ADVAIR) 250-50 MCG/DOSE AEPB   Inhalation   Inhale 1 puff into the lungs 2 (two) times daily.         . folic acid (FOLVITE) 1 MG tablet   Oral   Take 1 tablet (1 mg total) by mouth daily.   90 tablet   3   . furosemide (LASIX) 40 MG tablet   Oral   Take 40 mg by  mouth daily.          Marland Kitchen gabapentin (NEURONTIN) 100 MG capsule   Oral   Take 100 mg by mouth 3 (three) times daily.         Marland Kitchen lidocaine-prilocaine (EMLA) cream   Topical   Apply 1 application topically as needed (with each chemotherapy treatment).          . LORazepam (ATIVAN) 0.5 MG tablet   Oral   Take 0.5 mg by mouth every 8 (eight) hours as needed for anxiety.          Marland Kitchen losartan (COZAAR) 100 MG tablet   Oral   Take 100 mg by mouth daily.         . metoCLOPramide (REGLAN) 10 MG tablet   Oral   Take 10 mg by mouth every 8 (eight) hours as needed for nausea.         Marland Kitchen omeprazole (PRILOSEC) 20 MG capsule   Oral   Take 20 mg by mouth daily.         Marland Kitchen oxyCODONE (OXY IR/ROXICODONE) 5 MG immediate release tablet      Take 1 - 2 tablets  (5 - 10 mg) orally every 3-4 hours as needed for pain.   90 tablet   0   . promethazine (PHENERGAN) 25 MG tablet   Oral   Take 25 mg by mouth every 6 (six) hours as needed for nausea or vomiting.         . rosuvastatin  (CRESTOR) 10 MG tablet   Oral   Take 10 mg by mouth daily.         Marland Kitchen spironolactone (ALDACTONE) 25 MG tablet   Oral   Take 25 mg by mouth daily.           Allergies Quinapril hcl  No family history on file.  Social History History  Substance Use Topics  . Smoking status: Former Research scientist (life sciences)  . Smokeless tobacco: Not on file  . Alcohol Use: No    Review of Systems Constitutional: No fever/chills Eyes: No visual changes. ENT: No sore throat. Cardiovascular: Denies chest pain. Respiratory: Denies shortness of breath. Gastrointestinal: no vomiting.  No diarrhea.  No constipation. Genitourinary: Negative for dysuria. Musculoskeletal: Negative for back pain. Skin: Negative for rash. Neurological: Negative for headaches, focal weakness or numbness.  10-point ROS otherwise negative.  ____________________________________________   PHYSICAL EXAM:  VITAL SIGNS: ED Triage Vitals  Enc Vitals Group     BP 08/14/2014 1841 129/61 mmHg     Pulse Rate 08/14/2014 1841 121     Resp 09/04/2014 1841 19     Temp 08/19/2014 1841 103.1 F (39.5 C)     Temp Source 08/15/2014 1841 Oral     SpO2 08/25/2014 1841 97 %     Weight 08/27/2014 1841 190 lb (86.183 kg)     Height 08/17/2014 1841 '5\' 3"'$  (1.6 m)     Head Cir --      Peak Flow --      Pain Score 09/07/2014 1841 10     Pain Loc --      Pain Edu? --      Excl. in [place]? --     Constitutional: Alert and oriented. Well appearing and in no acute distress. Eyes: Conjunctivae are normal. PERRL. EOMI. Head: Atraumatic. Nose: No congestion/rhinnorhea. Mouth/Throat: Mucous membranes are moist.  Oropharynx non-erythematous. Neck: No stridor.   Cardiovascular: Tachycardic, regular rhythm. Grossly normal heart sounds.  Good peripheral circulation. Respiratory:  Normal respiratory effort.  No retractions. Lungs CTAB. Gastrointestinal: Soft with diffuse tenderness to palpation. No distention. No abdominal bruits. No CVA tenderness. Musculoskeletal: No boots  bilaterally to the lower extremities which were removed. Mild swelling with edema to the left thigh. Mild tenderness to palpation to the left thigh.  No joint effusions. Neurologic:  Normal speech and language. No gross focal neurologic deficits are appreciated. No gait instability. Skin:  Skin is warm, dry and intact. Darkened pigmentation to the right thigh that extends onto the inferior abdominal wall. The skin examined as indurated although the patient says that this is a chronic issue. Psychiatric: Mood and affect are normal. Speech and behavior are normal.  ____________________________________________   LABS (all labs ordered are listed, but only abnormal results are displayed)  Labs Reviewed  CBC WITH DIFFERENTIAL/PLATELET - Abnormal; Notable for the following:    RBC 2.80 (*)    Hemoglobin 8.2 (*)    HCT 25.0 (*)    RDW 20.7 (*)    Platelets 121 (*)    Neutro Abs 7.9 (*)    Lymphs Abs 0.2 (*)    Monocytes Absolute 0.1 (*)    All other components within normal limits  CULTURE, BLOOD (ROUTINE X 2)  CULTURE, BLOOD (ROUTINE X 2)  URINE CULTURE  COMPREHENSIVE METABOLIC PANEL  LACTIC ACID, PLASMA  LACTIC ACID, PLASMA  URINALYSIS COMPLETEWITH MICROSCOPIC ([facility] ONLY)   ____________________________________________  EKG   ____________________________________________  RADIOLOGY  No acute cardiopulmonary disease on the chest x-ray. I personally reviewed these images. ____________________________________________   PROCEDURES  CRITICAL CARE Performed by: Doran Stabler   Total critical care time: 35 minutes  Critical care time was exclusive of separately billable procedures and treating other patients.  Critical care was necessary to treat or prevent imminent or life-threatening deterioration.  Critical care was time spent personally by me on the following activities: development of treatment plan with patient and/or surrogate as well as nursing, discussions with  consultants, evaluation of patient's response to treatment, examination of patient, obtaining history from patient or surrogate, ordering and performing treatments and interventions, ordering and review of laboratory studies, ordering and review of radiographic studies, pulse oximetry and re-evaluation of patient's condition.  ____________________________________________   INITIAL IMPRESSION / ASSESSMENT AND PLAN / ED COURSE  Pertinent labs & imaging results that were available during my care of the patient were reviewed by me and considered in my medical decision making (see chart for details).  ----------------------------------------- 7:44 PM on 08/31/2014 -----------------------------------------  Patient made sepsis alert and sepsis protocol initiated  ----------------------------------------- 8:25 PM on 08/11/2014 -----------------------------------------  Patient is febrile 4 days status post chemotherapy. We'll admit to the hospital for IV antibiotics. Signed out to Dr. Anselm Jungling. Dr. Anselm Jungling aware that test results not fully back yet however patient will be admitted regardless of these results. Dr. Anselm Jungling agrees to see. ____________________________________________   FINAL CLINICAL IMPRESSION(S) / ED DIAGNOSES  Fever. Sepsis. Initial visit.    Orbie Pyo, MD 08/17/2014 2027

## 2014-08-30 DIAGNOSIS — C3490 Malignant neoplasm of unspecified part of unspecified bronchus or lung: Secondary | ICD-10-CM | POA: Insufficient documentation

## 2014-08-30 DIAGNOSIS — C3411 Malignant neoplasm of upper lobe, right bronchus or lung: Secondary | ICD-10-CM

## 2014-08-30 DIAGNOSIS — C7951 Secondary malignant neoplasm of bone: Secondary | ICD-10-CM

## 2014-08-30 DIAGNOSIS — R5081 Fever presenting with conditions classified elsewhere: Secondary | ICD-10-CM

## 2014-08-30 DIAGNOSIS — Z79899 Other long term (current) drug therapy: Secondary | ICD-10-CM

## 2014-08-30 DIAGNOSIS — D649 Anemia, unspecified: Secondary | ICD-10-CM

## 2014-08-30 DIAGNOSIS — E43 Unspecified severe protein-calorie malnutrition: Secondary | ICD-10-CM

## 2014-08-30 DIAGNOSIS — L03119 Cellulitis of unspecified part of limb: Secondary | ICD-10-CM

## 2014-08-30 LAB — COMPREHENSIVE METABOLIC PANEL
ALT: 11 U/L — ABNORMAL LOW (ref 14–54)
AST: 25 U/L (ref 15–41)
Albumin: 2.1 g/dL — ABNORMAL LOW (ref 3.5–5.0)
Alkaline Phosphatase: 69 U/L (ref 38–126)
Anion gap: 12 (ref 5–15)
BUN: 26 mg/dL — AB (ref 6–20)
CO2: 28 mmol/L (ref 22–32)
Calcium: 6 mg/dL — CL (ref 8.9–10.3)
Chloride: 104 mmol/L (ref 101–111)
Creatinine, Ser: 1.79 mg/dL — ABNORMAL HIGH (ref 0.44–1.00)
GFR calc Af Amer: 32 mL/min — ABNORMAL LOW (ref >60)
GFR calc non Af Amer: 28 mL/min — ABNORMAL LOW (ref >60)
Glucose, Bld: 152 mg/dL — ABNORMAL HIGH (ref 65–99)
Potassium: 4.2 mmol/L (ref 3.5–5.1)
SODIUM: 144 mmol/L (ref 135–145)
Total Bilirubin: 1.2 mg/dL (ref 0.3–1.2)
Total Protein: 6.1 g/dL — ABNORMAL LOW (ref 6.5–8.1)

## 2014-08-30 LAB — GLUCOSE, CAPILLARY
GLUCOSE-CAPILLARY: 120 mg/dL — AB (ref 65–99)
Glucose-Capillary: 146 mg/dL — ABNORMAL HIGH (ref 65–99)
Glucose-Capillary: 126 mg/dL — ABNORMAL HIGH (ref 65–99)
Glucose-Capillary: 112 mg/dL — ABNORMAL HIGH (ref 65–99)
Glucose-Capillary: 113 mg/dL — ABNORMAL HIGH (ref 65–99)

## 2014-08-30 LAB — BASIC METABOLIC PANEL
Anion gap: 9 (ref 5–15)
BUN: 26 mg/dL — ABNORMAL HIGH (ref 6–20)
CHLORIDE: 105 mmol/L (ref 101–111)
CO2: 30 mmol/L (ref 22–32)
Calcium: 5.8 mg/dL — CL (ref 8.9–10.3)
Creatinine, Ser: 1.77 mg/dL — ABNORMAL HIGH (ref 0.44–1.00)
GFR calc Af Amer: 33 mL/min — ABNORMAL LOW (ref >60)
GFR calc non Af Amer: 28 mL/min — ABNORMAL LOW (ref >60)
GLUCOSE: 141 mg/dL — AB (ref 65–99)
Potassium: 4.2 mmol/L (ref 3.5–5.1)
SODIUM: 144 mmol/L (ref 135–145)

## 2014-08-30 LAB — CBC
HEMATOCRIT: 24 % — AB (ref 35.0–47.0)
HEMOGLOBIN: 7.8 g/dL — AB (ref 12.0–16.0)
MCH: 29.5 pg (ref 26.0–34.0)
MCHC: 32.7 g/dL (ref 32.0–36.0)
MCV: 90.1 fL (ref 80.0–100.0)
Platelets: 106 10*3/uL — ABNORMAL LOW (ref 150–440)
RBC: 2.66 MIL/uL — AB (ref 3.80–5.20)
RDW: 20.7 % — AB (ref 11.5–14.5)
WBC: 6.6 10*3/uL (ref 3.6–11.0)

## 2014-08-30 LAB — LACTIC ACID, PLASMA: Lactic Acid, Venous: 0.8 mmol/L (ref 0.5–2.0)

## 2014-08-30 MED ORDER — ENSURE ENLIVE PO LIQD
237.0000 mL | Freq: Two times a day (BID) | ORAL | Status: DC
  Administered 2014-08-30: 237 mL via ORAL

## 2014-08-30 MED ORDER — SODIUM CHLORIDE 0.9 % IV SOLN
INTRAVENOUS | Status: AC
  Administered 2014-08-30 – 2014-08-31 (×3): via INTRAVENOUS

## 2014-08-30 MED ORDER — SENNOSIDES-DOCUSATE SODIUM 8.6-50 MG PO TABS
1.0000 | ORAL_TABLET | Freq: Two times a day (BID) | ORAL | Status: DC
  Administered 2014-08-30: 1 via ORAL
  Filled 2014-08-30 (×2): qty 1

## 2014-08-30 MED ORDER — CALCIUM CITRATE-VITAMIN D 500-400 MG-UNIT PO CHEW: 2.0000 | CHEWABLE_TABLET | Freq: Two times a day (BID) | ORAL | Status: DC

## 2014-08-30 MED ORDER — ACETAMINOPHEN 650 MG RE SUPP
650.0000 mg | Freq: Four times a day (QID) | RECTAL | Status: DC | PRN
  Administered 2014-08-30: 20:00:00 650 mg via RECTAL
  Filled 2014-08-30 (×2): qty 1

## 2014-08-30 MED ORDER — CALCIUM CARBONATE-VITAMIN D 500-200 MG-UNIT PO TABS
2.0000 | ORAL_TABLET | Freq: Two times a day (BID) | ORAL | Status: DC
  Administered 2014-08-30: 13:00:00 2 via ORAL
  Filled 2014-08-30 (×2): qty 2

## 2014-08-30 MED ORDER — DOCUSATE SODIUM 100 MG PO CAPS
200.0000 mg | ORAL_CAPSULE | Freq: Two times a day (BID) | ORAL | Status: DC
  Administered 2014-08-30: 13:00:00 200 mg via ORAL
  Filled 2014-08-30 (×2): qty 2

## 2014-08-30 MED ORDER — SODIUM CHLORIDE 0.9 % IV BOLUS (SEPSIS)
500.0000 mL | Freq: Once | INTRAVENOUS | Status: AC
  Administered 2014-08-30: 500 mL via INTRAVENOUS

## 2014-08-30 MED ORDER — MORPHINE SULFATE 2 MG/ML IJ SOLN
2.0000 mg | INTRAMUSCULAR | Status: DC | PRN
  Administered 2014-08-30 – 2014-09-01 (×5): 2 mg via INTRAVENOUS
  Filled 2014-08-30 (×5): qty 1

## 2014-08-30 NOTE — Care Management (Signed)
Admitted to [facility] with the diagnosis of fever. Lives with daughter, Darden Dates [phone] or [phone]). Sees Dr. Rosario Jacks. Last seen July 1st 2016. No Home Health. No skilled facility. Home oxygen thru [employer] x 10 years. Uses a rolling walker to aid in ambulation. Takes care of all [place] activities of daily living except needs help with shower. Chemotherapy in progress. Daughter will transport. Shelbie Ammons RN MSN Care Management [phone]

## 2014-08-30 NOTE — Progress Notes (Signed)
Dr. Marcille Blanco notified about 5.8 calcium level, yesterday it was 6.0. Will let rounding doctors address calcium level.

## 2014-08-30 NOTE — Progress Notes (Signed)
Initial Nutrition Assessment  DOCUMENTATION CODES:   Severe malnutrition in context of chronic illness  INTERVENTION:   Meals and snacks: Cater to pt preferences Nutrition Supplement Therapy: Recommend Ensure Enlive po BID, each supplement provides 350 kcal and 20 grams of protein Nutrition diet education: Discussed high protein high calorie foods with pt and dtr. Verbalized understanding.  Expect good compliance  NUTRITION DIAGNOSIS:   Inadequate oral intake related to cancer and cancer related treatments as evidenced by per patient/family report.    GOAL:   Patient will meet greater than or equal to 90% of their needs    MONITOR:    (Energy intake, Digestive system, Glucose profile)  REASON FOR ASSESSMENT:   Malnutrition Screening Tool    ASSESSMENT:   Pt admitted with fever, weakness, fatigue, cellulitis on abdomen and upper thigh  Past Medical History  Diagnosis Date  . COPD (chronic obstructive pulmonary disease)   . Diabetes mellitus without complication   . Lung cancer     Current Nutrition: ate few bites of breakfast per pt and dtr at bedside  Food/Nutrition-Related History: Dtr reports pt intake has decreased since Feb of 2106 after starting chemotherapy.  Most days eats 1 to 1/2 meals per day. Will drink boost 1-2 times per day at times.     Medications: folic acid, aspart, reglan, protonix  Electrolyte/Renal Profile and Glucose Profile:   Recent Labs Lab 08/25/14 0900 09/03/2014 1951 08/30/14 0532  NA  --  144 144  K  --  4.2 4.2  CL  --  104 105  CO2  --  28 30  BUN  --  26* 26*  CREATININE 1.64* 1.79* 1.77*  CALCIUM 6.4* 6.0* 5.8*  GLUCOSE  --  152* 141*   Protein Profile:   Recent Labs Lab 08/26/2014 1951  ALBUMIN 2.1*     Last BM:7/17   Nutrition-Focused Physical Exam Findings: Nutrition-Focused physical exam completed. Findings are no fat depletion, no muscle depletion, and mild edema.      Weight Change: Dtr reports 42  pound weight loss since Jan 2016 following dx of cancer (17% weight loss in the last 8 months)    Diet Order:  Diet Carb Modified Fluid consistency:: Thin; Room service appropriate?: Yes  Skin:   reviewed   Height:   Ht Readings from Last 1 Encounters:  09/01/2014 '5\' 3"'$  (1.6 m)    Weight:   Wt Readings from Last 1 Encounters:  08/28/2014 200 lb 9.6 oz (90.992 kg)       Wt Readings from Last 10 Encounters:  09/10/2014 200 lb 9.6 oz (90.992 kg)  08/25/14 201 lb 8 oz (91.4 kg)  08/22/14 202 lb 9.6 oz (91.9 kg)  08/08/14 200 lb 9.9 oz (91 kg)  07/31/14 193 lb (87.544 kg)  07/18/14 200 lb 9.9 oz (91 kg)  06/27/14 202 lb 6.1 oz (91.8 kg)    BMI:  Body mass index is 35.54 kg/(m^2).  Estimated Nutritional Needs:   Kcal:  Using IBW of 52kg (BEE 1019 kcals (IF 1.0-1.2, AF 1.3) 8466-5993 kcals/d.   Protein:  Using IBW of 52kg (1.0-1.2 g/kg) 52-62 g/d  Fluid:  Using IBW of 52kg (25-37m/kg) 1300-15671md  EDUCATION NEEDS:   Education needs addressed  HIGH Care Level Anastazja Isaac B. AlZenia ResidesRDHillburnLDMiamivillepager)

## 2014-08-30 NOTE — Progress Notes (Signed)
[place] at [facility]                                                                                                                                                                                            Patient Demographics   Sharon Duran, is a 69 y.o. female, DOB - November 22, 1945, BZJ:[id number]  Admit date - 08/18/2014   Admitting Physician Vaughan Basta, MD  Outpatient Primary MD for the patient is [facility], MD   LOS - 1  Subjective: Patient feels little better, has chronic abdominal pain, swelling in the abdomen and thigh improved     Review of Systems:   CONSTITUTIONAL: No documented fever. No fatigue, weakness. No weight gain, no weight loss.  EYES: No blurry or double vision.  ENT: No tinnitus. No postnasal drip. No redness of the oropharynx.  RESPIRATORY: No cough, no wheeze, no hemoptysis. No dyspnea.  CARDIOVASCULAR: No chest pain. No orthopnea. No palpitations. No syncope.  GASTROINTESTINAL: No nausea, no vomiting or diarrhea. Chronic abdominal pain. No melena or hematochezia.  GENITOURINARY: No dysuria or hematuria.  ENDOCRINE: No polyuria or nocturia. No heat or cold intolerance.  HEMATOLOGY: No anemia. No bruising. No bleeding.  INTEGUMENTARY: Erythema and swelling in the abdomen and thigh MUSCULOSKELETAL: No arthritis. No swelling. No gout.  NEUROLOGIC: No numbness, tingling, or ataxia. No seizure-type activity.  PSYCHIATRIC: No anxiety. No insomnia. No ADD.    Vitals:   Filed Vitals:   09/02/2014 1958 08/15/2014 2204 08/31/2014 2312 08/30/14 0500  BP:  127/73 121/54 103/42  Pulse:  99 98 92  Temp: 101 F (38.3 C) 100.9 F (38.3 C) 100.3 F (37.9 C) 99.8 F (37.7 C)  TempSrc: Oral Oral Oral Oral  Resp:  '16 18 18  '$ Height:   '5\' 3"'$  (1.6 m)   Weight:   90.992 kg (200 lb 9.6 oz)   SpO2:  100% 100% 100%    Wt Readings from Last 3 Encounters:  08/15/2014 90.992 kg (200 lb 9.6 oz)  08/25/14 91.4 kg (201 lb 8  oz)  08/22/14 91.9 kg (202 lb 9.6 oz)     Intake/Output Summary (Last 24 hours) at 08/30/14 1200 Last data filed at 08/30/14 0800  Gross per 24 hour  Intake    240 ml  Output      1 ml  Net    239 ml    Physical Exam:   GENERAL: Pleasant-appearing in no apparent distress.  HEAD, EYES, EARS, NOSE AND THROAT: Atraumatic, normocephalic. Extraocular muscles are intact. Pupils equal and reactive to light. Sclerae anicteric. No conjunctival injection. No oro-pharyngeal erythema.  NECK:  Supple. There is no jugular venous distention. No bruits, no lymphadenopathy, no thyromegaly.  HEART: Regular rate and rhythm, tachycardic. No murmurs, no rubs, no clicks.  LUNGS: Clear to auscultation bilaterally. No rales or rhonchi. No wheezes.  ABDOMEN: Soft, flat, nontender, nondistended. Has good bowel sounds. No hepatosplenomegaly appreciated.  EXTREMITIES: No evidence of any cyanosis, clubbing, or peripheral edema.  +2 pedal and radial pulses bilaterally.  NEUROLOGIC: The patient is alert, awake, and oriented x3 with no focal motor or sensory deficits appreciated bilaterally.  SKIN: Moist and warm with no rashes appreciated.  Psych: Not anxious, depressed LN: No inguinal LN enlargement    Antibiotics   Anti-infectives    Start     Dose/Rate Route Frequency Ordered Stop   08/30/14 0500  vancomycin (VANCOCIN) 500 mg in sodium chloride 0.9 % 100 mL IVPB  Status:  Discontinued     500 mg 100 mL/hr over 60 Minutes Intravenous Every 24 hours 08/28/2014 2205 08/19/2014 2207   08/30/14 0500  vancomycin (VANCOCIN) IVPB 750 mg/150 ml premix     750 mg 150 mL/hr over 60 Minutes Intravenous Every 24 hours 08/15/2014 2207     08/30/14 0400  piperacillin-tazobactam (ZOSYN) IVPB 3.375 g     3.375 g 12.5 mL/hr over 240 Minutes Intravenous Every 8 hours 09/03/2014 2205     09/07/2014 1945  piperacillin-tazobactam (ZOSYN) IVPB 3.375 g     3.375 g 100 mL/hr over 30 Minutes Intravenous  Once 09/03/2014 1932 09/02/2014 2032    09/03/2014 1945  vancomycin (VANCOCIN) IVPB 1000 mg/200 mL premix     1,000 mg 200 mL/hr over 60 Minutes Intravenous  Once 08/24/2014 1932 08/23/2014 2205      Medications   Scheduled Meds: . aspirin EC  81 mg Oral Daily  . atenolol  50 mg Oral Daily  . baclofen  10 mg Oral TID  . feeding supplement (ENSURE ENLIVE)  237 mL Oral BID BM  . fentaNYL  100 mcg Transdermal Q72H  . fentaNYL  25 mcg Transdermal Q72H  . folic acid  1 mg Oral Daily  . gabapentin  100 mg Oral TID  . heparin  5,000 Units Subcutaneous 3 times per day  . insulin aspart  0-9 Units Subcutaneous TID WC  . mometasone-formoterol  2 puff Inhalation BID  . pantoprazole  40 mg Oral Daily  . piperacillin-tazobactam (ZOSYN)  IV  3.375 g Intravenous Q8H  . rosuvastatin  10 mg Oral Daily  . vancomycin  750 mg Intravenous Q24H   Continuous Infusions:  PRN Meds:.LORazepam, metoCLOPramide, oxyCODONE   Data Review:   Micro Results No results found for this or any previous visit (from the past 240 hour(s)).  Radiology Reports Ct Abdomen Pelvis Wo Contrast  07/31/2014   CLINICAL DATA:  Abdominal pain, nausea for 1 week.  EXAM: CT ABDOMEN AND PELVIS WITHOUT CONTRAST  TECHNIQUE: Multidetector CT imaging of the abdomen and pelvis was performed following the standard protocol without IV contrast.  COMPARISON:  CT 01/19/2014.  PET CT 01/31/2014.  FINDINGS: Lower chest: Lung bases are clear. No effusions. Heart is normal size.  Hepatobiliary: No biliary ductal dilatation or focal hepatic lesion. Gallbladder grossly unremarkable.  Pancreas: No focal abnormality or ductal dilatation.  Spleen: No focal abnormality.  Normal size.  Adrenals/Urinary Tract: Nodule within the left adrenal gland is stable since prior PET CT and measures the density compatible with adenoma. Mild diffuse fullness of the right adrenal gland suggesting hyperplasia. Cysts within the kidneys bilaterally. No hydronephrosis. Urinary  bladder decompressed. Bladder wall  appears mildly thickened but difficult to evaluate due to decompressed state.  Stomach/Bowel: Stomach, large and small bowel grossly unremarkable. Appendix is visualized and unremarkable.  Vascular/Lymphatic: Aorta is calcified, non aneurysmal. No retroperitoneal or mesenteric adenopathy.  Reproductive: Prior hysterectomy.  No adnexal masses.  Other: Small to moderate free fluid in the pelvis. Small amount of free fluid adjacent to the right liver edge. These findings are new since prior study.  Musculoskeletal: Focal lytic lesion involving the left L3 vertebral body, pedicle and transverse processes again noted, stable. Stable lytic lesion within the left iliac bone and left acetabulum. Stable lytic lesion within the proximal right femur.  IMPRESSION: Questionable bladder wall thickening, but the bladder is decompressed and difficult to evaluate. Recommend clinical correlation to exclude cystitis.  Small to moderate free fluid in the pelvis and adjacent to the right liver edge.  Stable bony metastatic disease.   Electronically Signed   By: Rolm Baptise M.D.   On: 07/31/2014 18:24   US Venous Img Lower Unilateral Left  09/02/2014   CLINICAL DATA:  Left leg PAIN, edema. Lung carcinoma with metastases.  EXAM: LEFT LOWER EXTREMITY VENOUS DOPPLER ULTRASOUND  TECHNIQUE: Gray-scale sonography with compression, as well as color and duplex ultrasound, were performed to evaluate the deep venous system from the level of the common femoral vein through the popliteal and proximal calf veins.  COMPARISON:  04/03/2014  FINDINGS: Normal compressibility of the common femoral, superficial femoral, and popliteal veins, as well as the proximal calf veins. No filling defects to suggest DVT on grayscale or color Doppler imaging. Doppler waveforms show normal direction of venous flow, normal respiratory phasicity and response to augmentation. Survey views of the contralateral common femoral vein are unremarkable.  IMPRESSION: 1. No  evidence of lower extremity deep vein thrombosis, LEFT.   Electronically Signed   By: Lucrezia Europe M.D.   On: 08/17/2014 20:31   Dg Chest Port 1 View  08/18/2014   CLINICAL DATA:  Pt here for abdominal pain, back pain and leg pain. Pt vomiting in triage room. Pt last chemo tx was Friday morning, pt being treated for bone and lung cancer. Pt with fever. H/o COPD  EXAM: PORTABLE CHEST - 1 VIEW  COMPARISON:  CT 07/11/2014 and earlier studies  FINDINGS: Right IJ port catheter to the low SVC. Lungs clear. Stable right hilar fullness. Heart size upper limits normal for technique. Atheromatous aortic arch. No pneumothorax. No effusion. Spondylitic changes in the mid and lower thoracic spine.  IMPRESSION: No acute cardiopulmonary disease.   Electronically Signed   By: Lucrezia Europe M.D.   On: 08/27/2014 20:15     CBC  Recent Labs Lab 08/25/14 0900 08/17/2014 1006 08/27/2014 1951 08/30/14 0532  WBC 4.4 7.7 8.1 6.6  HGB 8.8* 8.2* 8.2* 7.8*  HCT 27.0* 25.6* 25.0* 24.0*  PLT 166 131* 121* 106*  MCV 88.9 89.9 89.1 90.1  MCH 29.0 28.8 29.1 29.5  MCHC 32.6 32.0 32.7 32.7  RDW 21.5* 21.1* 20.7* 20.7*  LYMPHSABS 0.3* 0.1* 0.2*  --   MONOABS 0.6 0.1* 0.1*  --   EOSABS 0.0 0.0 0.0  --   BASOSABS 0.0 0.0 0.0  --     Chemistries   Recent Labs Lab 08/25/14 0900 08/23/2014 1951 08/30/14 0532  NA  --  144 144  K  --  4.2 4.2  CL  --  104 105  CO2  --  28 30  GLUCOSE  --  152* 141*  BUN  --  26* 26*  CREATININE 1.64* 1.79* 1.77*  CALCIUM 6.4* 6.0* 5.8*  AST  --  25  --   ALT  --  11*  --   ALKPHOS  --  69  --   BILITOT  --  1.2  --    ------------------------------------------------------------------------------------------------------------------ estimated creatinine clearance is 32.6 mL/min (by C-G formula based on Cr of 1.77). ------------------------------------------------------------------------------------------------------------------ No results for input(s): HGBA1C in the last 72  hours. ------------------------------------------------------------------------------------------------------------------ No results for input(s): CHOL, HDL, LDLCALC, TRIG, CHOLHDL, LDLDIRECT in the last 72 hours. ------------------------------------------------------------------------------------------------------------------ No results for input(s): TSH, T4TOTAL, T3FREE, THYROIDAB in the last 72 hours.  Invalid input(s): FREET3 ------------------------------------------------------------------------------------------------------------------ No results for input(s): VITAMINB12, FOLATE, FERRITIN, TIBC, IRON, RETICCTPCT in the last 72 hours.  Coagulation profile No results for input(s): INR, PROTIME in the last 168 hours.  No results for input(s): DDIMER in the last 72 hours.  Cardiac Enzymes No results for input(s): CKMB, TROPONINI, MYOGLOBIN in the last 168 hours.  Invalid input(s): CK ------------------------------------------------------------------------------------------------------------------ Invalid input(s): POCBNP    Assessment & Plan   * Fever Due to cellulitis, on lower abdominal wall and upper thighs. Continue vancomycin and Zosyn IV for now. Seems to be responding we can change to oral anabiotic's tomorrow  * Lung cancer with metastasis On chemotherapy, last chemotherapy was 5 days ago Seen by oncology continue current care  * Diabetes Blood glucose are stable continue sliding scale insulin  * Chronic renal failure Monitor  * Hypocalcemia Hypo-albuminemia start calcium supplements, check corrected calcium  * COPD Continue dulara     Code Status Orders        Start     Ordered   08/28/2014 2248  Full code   Continuous     09/07/2014 2247    Advance Directive Documentation        Most Recent Value   Type of Advance Directive  Healthcare Power of Attorney   Pre-existing out of facility DNR order (yellow form or pink MOST form)     "MOST" Form  in Place?             Consults   oncology  DVT Prophylaxis  heparin  Lab Results  Component Value Date   PLT 106* 08/30/2014     Time Spent in minutes   71mn     Payson Evrard, SChana BodeM.D on 08/30/2014 at 12:00 PM  Between 7am to 6pm - Pager - [phone]  After 6pm go to [email or web address] - password [email or web address]   Office  [phone]

## 2014-08-30 NOTE — Consult Note (Signed)
ONCOLOGY CONSULTATION NOTE -  Reason for consultation: Known history of stage IV non-small cell lung cancer on chemotherapy, admitted with fever and cellulitis  HISTORY OF PRESENT ILLNESS:  Patient is a 69 year old female with history of multiple medical problems as detailed below with known history of stage IV non-small cell lung cancer diagnosed December 2015 (adenocarcinoma, diagnosed by CT-guided biopsy of L3 bony lesion) who has been recently on single agent Alimta chemotherapy. Patient has had issues with recurrent bilateral leg swelling and follows with vascular surgery for management, more lately she had been doing better. She has severe chronic weakness and is mostly wheelchair-bound. She also had recent severe anemia of unclear etiology, recent workup mostly unremarkable and has been getting blood transfusion support if hemoglobin drops to less than 7. Patient most recently had Alimta chemotherapy last week at lower dose of 400 mg/m IV. She has been admitted to the hospital with fever and found to have cellulitis of both lower extremities proximally and in the lower abdominal wall. Denies any worsening of chronic cough. No new shortness of breath, sputum, hemoptysis or chest pain. No diarrhea. No dysuria or hematuria. Appetite is fair.   REVIEW OF SYSTEMS:  ROS As in HPI above. In addition, no new headaches or focal weakness. No new sore throat or dysphagia. No hemoptysis or chest pain. No dizziness or palpitation. No constipation, diarrhea, dysuria or hematuria. No new skin rash or bleeding symptoms. She has mild tingling and numbness in the hands and feet. No polyuria polydipsia.  PS ECOG 3.  PAST MEDICAL HISTORY:  Hypertension Hyperlipidemia Type 2 diabetes mellitus Sleep apnea Goiter Chronic renal insufficiency GERD COPD Cataracts  PAST SURGICAL HISTORY: Partial hysterectomy  FAMILY HISTORY: remarkable for diabetes, ovarian  cancer (mother), lung cancer (sister).   SOCIAL HISTORY: History  Substance Use Topics  . Smoking status: Former Research scientist (life sciences)  . Smokeless tobacco: Not on file  . Alcohol Use: No  Ex-smoker, quit in 1991, 25-pack-year history. Denies alcohol usage. Tries to be physically active.  Allergies  Allergen Reactions  . Quinapril Hcl Nausea And Vomiting    Current Outpatient Prescriptions  Medication Sig Dispense Refill  . aspirin EC 81 MG tablet Take 81 mg by mouth daily.    . fentaNYL (DURAGESIC) 100 MCG/HR Place 1 patch (100 mcg total) onto the skin every 3 (three) days. 10 patch 0  . furosemide (LASIX) 40 MG tablet Take 40 mg by mouth daily.     Marland Kitchen gabapentin (NEURONTIN) 100 MG capsule Take 100 mg by mouth 3 (three) times daily.    Marland Kitchen glipiZIDE (GLUCOTROL XL) 10 MG 24 hr tablet Take 10 mg by mouth daily.     Marland Kitchen lidocaine-prilocaine (EMLA) cream Apply 1 application topically as needed (with each chemotherapy treatment).     . LORazepam (ATIVAN) 0.5 MG tablet Take 0.5 mg by mouth every 8 (eight) hours as needed for anxiety.     Marland Kitchen losartan (COZAAR) 100 MG tablet Take 100 mg by mouth daily.    . metoCLOPramide (REGLAN) 10 MG tablet Take 10 mg by mouth every 8 (eight) hours as needed for nausea.    Marland Kitchen omeprazole (PRILOSEC) 20 MG capsule Take 20 mg by mouth daily.    . promethazine (PHENERGAN) 25 MG tablet Take 25 mg by mouth every 6 (six) hours as needed for nausea or vomiting.    . rosuvastatin (CRESTOR) 10 MG tablet Take 10 mg by mouth daily.    Marland Kitchen spironolactone (ALDACTONE) 25 MG tablet Take 25 mg  by mouth daily.    Marland Kitchen amLODipine (NORVASC) 5 MG tablet Take 5 mg by mouth daily.    . baclofen (LIORESAL) 10 MG tablet Take 10 mg by mouth 3 (three) times daily.    . fentaNYL (DURAGESIC - DOSED MCG/HR) 25 MCG/HR patch Place 1 patch (25 mcg total) onto the skin every 3 (three) days. 7 patch 0  .  Fluticasone-Salmeterol (ADVAIR) 250-50 MCG/DOSE AEPB Inhale 1 puff into the lungs 2 (two) times daily.    . folic acid (FOLVITE) 1 MG tablet Take 1 mg by mouth daily.    Marland Kitchen oxyCODONE (OXY IR/ROXICODONE) 5 MG immediate release tablet Take 1 - 2 tablets (5 - 10 mg) orally every 3-4 hours as needed for pain. 90 tablet 0   No current facility-administered medications for this visit.   Facility-Administered Medications Ordered in Other Visits  Medication Dose Route Frequency Provider Last Rate Last Dose  . cyanocobalamin ((VITAMIN B-12)) injection 1,000 mcg 1,000 mcg Intramuscular Q21 days Leia Alf, MD  1,000 mcg at 06/27/14 1608  . cyanocobalamin ((VITAMIN B-12)) injection 1,000 mcg 1,000 mcg Intramuscular Once Leia Alf, MD      OBJECTIVE: Filed Vitals:     BP:  106/57  Pulse:  88   Resp:  18    Body mass index is 35.55 kg/(m^2). ECOG FS:2 - Symptomatic, <50% confined to bed  GENERAL: Patient is chronically weak-looking, resting in bed on nasal cannula oxygen, otherwise alert and oriented and in no acute distress. No icterus. Pallor present. HEENT: EOMs intact. Oral exam negative for thrush or lesions.   CVS: S1S2, regular LUNGS: Bilaterally diminished breath sounds more so at bases, no rhonchi. ABDOMEN: Soft, nontender, there is swelling and erythema over the lower abdominal wall with some tenderness. No hepatomegaly. Bowel sounds present.  NEURO: Moves all extremities spontaneously, cranial nerves are intact. EXTREMITIES: b/l pedal edema, has notable swelling in both thighs with patches of erythematous areas with mild tenderness.  LAB RESULTS: Hemoglobin 7.8, platelets 106, WBC 6600, creatinine 1.77, calcium 5.8                  STUDIES: 07/11/14 - CT Chest. IMPRESSION: 1. Dominant perihilar nodule in the right upper lobe is stable to minimally decreased in the interval. 2. Similar appearance of  retroesophageal adenopathy. 3. Dominant nodule in left lobe of thyroid gland is unchanged. 4. Aortic atherosclerosis and coronary artery calcifications.  07/31/14 - CT scan abdomen/pelvis. IMPRESSION: Questionable bladder wall thickening, but the bladder is decompressed and difficult to evaluate. Recommend clinical correlation to exclude cystitis. Small to moderate free fluid in the pelvis and adjacent to the right liver edge. Stable bony metastatic disease.  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 Agree with ongoing aggressive treatment, broad-spectrum antibiotic coverage with Zosyn plus vancomycin, await results of blood cultures. CBC otherwise does not show any neutropenia. Patient has persistent significant anemia, continue to monitor CBC daily while in hospital and transfuse packed red blood cells if hemoglobin is <7 or as indicated by symptoms. Mild thrombocytopenia also likely from recent chemotherapy, continue to monitor. Supplement calcium, she has hypocalcemia likely from [facility] treatment. Otherwise no major pain issues bleeding symptoms at this time. Oncology will continue to follow intermittently as indicated. Thank you for the referral, please feel free  to contact me if any additional questions.

## 2014-08-30 NOTE — Plan of Care (Signed)
Problem: Phase III Progression Outcomes Goal: Other Phase III Outcomes/Goals Outcome: Progressing Patient is alert and oriented, no complaints of pain at this time. 1 assist to the bathroom, family at bedside.

## 2014-08-30 NOTE — Plan of Care (Signed)
Problem: Phase III Progression Outcomes Goal: Other Phase III Outcomes/Goals Plan of care progress to goal; Pain: med given x1 with improvement Voiding: incontinent

## 2014-08-30 NOTE — Progress Notes (Signed)
Called to assess pt due to somnolence and waxing and waning mental status.  Chart reviewed.  Examined pt who was easily arousable but clearly very ill.  D/w family that she is in end stages of cancer.  They informed me that they have agreed to stop chemo for now.  Also wants pt comfortable but still able to communicate.  Will order low dose morphine iv.  Also start IVF.  Family wants pt DNR.  Orders placed.

## 2014-08-30 NOTE — Progress Notes (Signed)
Spoke with Dr. Vianne Bulls about pt's high fever.  MD gave telephone order for tylenol and NS bolus.  Will continue to monitor.  Clarise Cruz, RN

## 2014-08-31 LAB — URINE CULTURE: Culture: NO GROWTH

## 2014-08-31 LAB — GLUCOSE, CAPILLARY
GLUCOSE-CAPILLARY: 116 mg/dL — AB (ref 65–99)
Glucose-Capillary: 101 mg/dL — ABNORMAL HIGH (ref 65–99)
Glucose-Capillary: 100 mg/dL — ABNORMAL HIGH (ref 65–99)
Glucose-Capillary: 106 mg/dL — ABNORMAL HIGH (ref 65–99)

## 2014-08-31 MED ORDER — SODIUM CHLORIDE 0.9 % IV BOLUS (SEPSIS)
250.0000 mL | Freq: Once | INTRAVENOUS | Status: AC
  Administered 2014-08-31: 250 mL via INTRAVENOUS

## 2014-08-31 MED ORDER — ACETAMINOPHEN 650 MG RE SUPP
650.0000 mg | RECTAL | Status: DC | PRN
  Administered 2014-08-31 (×3): 650 mg via RECTAL
  Filled 2014-08-31 (×2): qty 1

## 2014-08-31 MED ORDER — KETOROLAC TROMETHAMINE 15 MG/ML IJ SOLN
15.0000 mg | Freq: Once | INTRAMUSCULAR | Status: AC
  Administered 2014-08-31: 15 mg via INTRAVENOUS
  Filled 2014-08-31: qty 1

## 2014-08-31 MED ORDER — DEXTROSE 5 % IV SOLN
1.0000 g | INTRAVENOUS | Status: DC
  Administered 2014-08-31: 10:00:00 1 g via INTRAVENOUS
  Filled 2014-08-31 (×3): qty 10

## 2014-08-31 MED ORDER — CEFTRIAXONE SODIUM IN DEXTROSE 20 MG/ML IV SOLN: 1.0000 g | INTRAVENOUS | Status: DC

## 2014-08-31 MED ORDER — SODIUM CHLORIDE 0.9 % IV SOLN
1.0000 g | Freq: Once | INTRAVENOUS | Status: AC
  Administered 2014-08-31: 1 g via INTRAVENOUS
  Filled 2014-08-31: qty 10

## 2014-08-31 NOTE — Progress Notes (Signed)
Dr. Ulysees Barns notified of blood pressure of 87/45 and bladder scan result of 365m. Order received to place foley catheter. BMadlyn Frankel RN

## 2014-08-31 NOTE — Progress Notes (Signed)
Pt has not void this shift.  Bladder scan done and shows 80m in bladder at this time.  Pt's fluids continue at 74mhr per orders. Charge nurse aware.  Will continue to monitor.  DaClarise CruzRN

## 2014-08-31 NOTE — Progress Notes (Signed)
Spoke with Dr. Reece Levy to make him aware of pt's BP after NS bolus.  MD ok with current BP of 99/44.  IV fluids increased to 187m/hr. Will continue to monitor.  DClarise Cruz RN

## 2014-08-31 NOTE — Progress Notes (Signed)
Spoke with Dr. Marcille Blanco about pt's fever that has only slightly improved with prn tylenol.  Telephone order given for Toradol IV.  Will continue to monitor.  Clarise Cruz, RN

## 2014-08-31 NOTE — Progress Notes (Signed)
Notified Dr. Bridgett Larsson of bladder scan result of 226m - order to place foley catheter if patient does not void within an hour. BMadlyn Frankel RN

## 2014-08-31 NOTE — Evaluation (Signed)
Clinical/Bedside Swallow Evaluation Patient Details  Name: TONNA PALAZZI MRN: [id number] Date of Birth: 12/05/1945  Today's Date: 08/31/2014 Time: SLP Start Time (ACUTE ONLY): 48 SLP Stop Time (ACUTE ONLY): 1130 SLP Time Calculation (min) (ACUTE ONLY): 60 min  Past Medical History:  Past Medical History  Diagnosis Date  . COPD (chronic obstructive pulmonary disease)   . Diabetes mellitus without complication   . Lung cancer    Past Surgical History:  Past Surgical History  Procedure Laterality Date  . Portacath placement     HPI:  Pt is a 69 y.o. female with a known history of diabetes, COPD and lung cancer with metastasis, who had last chemotherapy 5 days ago- has chronic abdominal pain for last few months, and she walks with a walker at baseline. Now for last 1 or 2 days she is more weak and it's even harder for her to walk with walker up to the bathroom. Concerned with this family brought her to emergency room today, that she also had complain of excessive pain in her back her abdomen and her legs.MD called to assess pt last evening due to somnolence and waxing and waning mental status.Pt was easily arousable but clearly very ill.MD d/w family that she is in end stages of cancer; family stated they have agreed to stop chemo for now but want pt comfortable but still able to communicate. NSG held po's and meds until BSE this morning. Family stated pt seemed more "herself" and "better" this morning.   Assessment / Plan / Recommendation Clinical Impression  Pt appeared to safely tolerate trials of thin liquids and soft solids during this BSE today w/ no overt s/s of aspiration noted and no overt oral phase deficits noted. Pt appears at reduced risk for aspiration following general aspiration precautions. Pt requires support for feeding self during eating/drinking d/t shaky UEs. Education given to family on general aspiration precautions; rec. meds in puree as nec. ST will f/u as  indicated. Family and pt agreed.     Aspiration Risk   (reduced)    Diet Recommendation Age appropriate regular solids;Thin (meats cut well for pt; moistened)   Medication Administration: Whole meds with puree (if nec. ) Compensations: Slow rate;Small sips/bites (clear well b/t bites)    Other  Recommendations Recommended Consults:  (TBD) Oral Care Recommendations: Oral care before and after PO;Staff/trained caregiver to provide oral care   Follow Up Recommendations       Frequency and Duration min 3x week  1 week   Pertinent Vitals/Pain Denied any pain    SLP Swallow Goals  see care plan   Swallow Study Prior Functional Status   reported she was able to eat/drink w/out difficulty at home; "I take my pills a few at a time" w/ liquids.     General Date of Onset: 08/30/14 Other Pertinent Information: Pt is a 69 y.o. female with a known history of diabetes, COPD and lung cancer with metastasis, who had last chemotherapy 5 days ago- has chronic abdominal pain for last few months, and she walks with a walker at baseline. Now for last 1 or 2 days she is more weak and it's even harder for her to walk with walker up to the bathroom. Concerned with this family brought her to emergency room today, that she also had complain of excessive pain in her back her abdomen and her legs.MD called to assess pt last evening due to somnolence and waxing and waning mental status.Pt was easily arousable but  clearly very ill.MD d/w family that she is in end stages of cancer; family stated they have agreed to stop chemo for now but want pt comfortable but still able to communicate. NSG held po's and meds until BSE this morning. Family stated pt seemed more "herself" and "better" this morning. Type of Study: Bedside swallow evaluation Previous Swallow Assessment: none indicated Diet Prior to this Study: Regular;Thin liquids Temperature Spikes Noted: Yes (no elevation in WBC in past 2 days; 6.6 this  AM.) Respiratory Status: Supplemental O2 delivered via (comment) History of Recent Intubation: No Behavior/Cognition: Alert;Cooperative;Pleasant mood;Requires cueing (shaky UEs) Oral Cavity - Dentition: Missing dentition Self-Feeding Abilities: Able to feed self;Needs assist (shaky UEs) Patient Positioning: Upright in bed Baseline Vocal Quality: Normal;Low vocal intensity Volitional Cough: Strong Volitional Swallow: Able to elicit    Oral/Motor/Sensory Function Overall Oral Motor/Sensory Function: Appears within functional limits for tasks assessed Labial ROM: Within Functional Limits Labial Symmetry: Within Functional Limits Labial Strength: Within Functional Limits Lingual ROM: Within Functional Limits Lingual Symmetry: Within Functional Limits Lingual Strength: Within Functional Limits Facial Symmetry: Within Functional Limits Mandible: Within Functional Limits   Ice Chips Ice chips: Within functional limits Presentation: Spoon (fed; 5 trials)   Thin Liquid Thin Liquid: Within functional limits Presentation: Self Fed;Cup;Straw (assisted w/ support of cup; ~3 ozs total)    Nectar Thick Nectar Thick Liquid: Not tested   Honey Thick Honey Thick Liquid: Not tested   Puree Puree: Within functional limits Presentation: Self Fed;Spoon (assisted; 10 trials total)   Solid   GO    Solid: Within functional limits Presentation: Self Fed (2 trials of graham cracker w/ ice cream)      Orinda Kenner, MS, CCC-SLP Watson,Katherine 08/31/2014,11:42 AM

## 2014-08-31 NOTE — Care Management Important Message (Signed)
Important Message  Patient Details  Name: Sharon Duran MRN: [id number] Date of Birth: [birth year]   Medicare Important Message Given:  Yes-second notification given    [name] 08/31/2014, 10:06 AM

## 2014-08-31 NOTE — Clinical Documentation Improvement (Signed)
.   Document the stage of CKD --Chronic kidney disease, stage 1- GFR > OR = 90 --Chronic kidney disease, stage 2 (mild) - GFR 60-89 --Chronic kidney disease, stage 3 (moderate) - GFR 30-59 --Chronic kidney disease, stage 4 (severe) - GFR 15-29 --Chronic kidney disease, stage 5- GFR < 15 --End-stage renal disease (ESRD) . Document any underlying cause of CKD such as Diabetes or Hypertension . Document if the patient is dependent on Dialysis . Chronic renal failure without a documented stage will be assigned to Chronic kidney disease, unspecified . Document any associated diagnoses/conditions    Supporting Information: Chronic renal failure noted per 7/19 progress notes.    (CRF codes to chronic kidney disease)  Labs:  Bun   Creat   GFR: 7/20:    26     1.77      33 7/19:    26     1.79      32    Thank You,  Russ Halo Documentation Specialist [phone] [email or web address]

## 2014-08-31 NOTE — Plan of Care (Signed)
Problem: Phase III Progression Outcomes Goal: Other Phase III Outcomes/Goals Plan of care progress to goal:  Patient complaining of abdominal pain - pain medication given with relief. Afebrile this shift. NS infusing as ordered. IV antibiotics as ordered. SLP eval. Family at bedside, very supportive of patient.

## 2014-08-31 NOTE — Progress Notes (Signed)
[place] at [facility]                                                                                                                                                                                            Patient Demographics   Sharon Duran, is a 69 y.o. female, DOB - 07-03-1945, ERD:[id number]  Admit date - 09/08/2014   Admitting Physician Vaughan Basta, MD  Outpatient Primary MD for the patient is [facility], MD   LOS - 2  Subjective: overnight pt bp dropped, family changed her code to dnr, pt curretnly denies any complaints    Review of Systems:   CONSTITUTIONAL: + fever. No fatigue, weakness. No weight gain, no weight loss.  EYES: No blurry or double vision.  ENT: No tinnitus. No postnasal drip. No redness of the oropharynx.  RESPIRATORY: no cough, no wheeze, no hemoptysis. No dyspnea.  CARDIOVASCULAR: No chest pain. No orthopnea. No palpitations. No syncope.  GASTROINTESTINAL: No nausea, no vomiting or diarrhea. Chronic abdominal pain. No melena or hematochezia.  GENITOURINARY: No dysuria or hematuria.  ENDOCRINE: No polyuria or nocturia. No heat or cold intolerance.  HEMATOLOGY: No anemia. No bruising. No bleeding.  INTEGUMENTARY: Erythema and swelling in the abdomen and thigh MUSCULOSKELETAL: No arthritis. No swelling. No gout.  NEUROLOGIC: No numbness, tingling, or ataxia. No seizure-type activity.  PSYCHIATRIC: No anxiety. No insomnia. No ADD.    Vitals:   Filed Vitals:   08/31/14 0307 08/31/14 0454 08/31/14 0533 08/31/14 0803  BP:  '82/36 99/44 87/45 '$  Pulse:  92 84 82  Temp: 100.9 F (38.3 C) 99.4 F (37.4 C)  99.2 F (37.3 C)  TempSrc: Oral Oral  Oral  Resp:      Height:      Weight:      SpO2:  96%  99%    Wt Readings from Last 3 Encounters:  08/18/2014 90.992 kg (200 lb 9.6 oz)  08/25/14 91.4 kg (201 lb 8 oz)  08/22/14 91.9 kg (202 lb 9.6 oz)     Intake/Output Summary (Last 24 hours) at 08/31/14  0856 Last data filed at 08/31/14 5631  Gross per 24 hour  Intake    975 ml  Output    350 ml  Net    625 ml    Physical Exam:   GENERAL: Pleasant-appearing chronically ill HEAD, EYES, EARS, NOSE AND THROAT: Atraumatic, normocephalic. Extraocular muscles are intact. Pupils equal and reactive to light. Sclerae anicteric. No conjunctival injection. No oro-pharyngeal erythema.  NECK: Supple. There is no jugular venous distention. No bruits, no lymphadenopathy, no thyromegaly.  HEART: Regular rate and  rhythm, tachycardic. No murmurs, no rubs, no clicks.  LUNGS: Clear to auscultation bilaterally. No rales or rhonchi. No wheezes.  ABDOMEN: Soft, flat, nontender, nondistended. Has good bowel sounds. No hepatosplenomegaly appreciated.  EXTREMITIES: No evidence of any cyanosis, clubbing, or peripheral edema.  +2 pedal and radial pulses bilaterally.  NEUROLOGIC: The patient is alert, awake, and oriented x3 with no focal motor or sensory deficits appreciated bilaterally.  SKIN: Moist and warm with no rashes appreciated. wormth in groin and abdomen Psych: Not anxious, depressed LN: No inguinal LN enlargement    Antibiotics   Anti-infectives    Start     Dose/Rate Route Frequency Ordered Stop   08/31/14 1000  cefTRIAXone (ROCEPHIN) 1 g in dextrose 5 % 50 mL IVPB    Comments:  Indication: UTI   1 g 100 mL/hr over 30 Minutes Intravenous Every 24 hours 08/31/14 0851     08/31/14 0900  cefTRIAXone (ROCEPHIN) 1 g in dextrose 5 % 50 mL IVPB - Premix  Status:  Discontinued     1 g 100 mL/hr over 30 Minutes Intravenous Every 24 hours 08/31/14 0849 08/31/14 0850   08/30/14 0500  vancomycin (VANCOCIN) 500 mg in sodium chloride 0.9 % 100 mL IVPB  Status:  Discontinued     500 mg 100 mL/hr over 60 Minutes Intravenous Every 24 hours 08/12/2014 2205 08/30/2014 2207   08/30/14 0500  vancomycin (VANCOCIN) IVPB 750 mg/150 ml premix     750 mg 150 mL/hr over 60 Minutes Intravenous Every 24 hours 08/16/2014 2207      08/30/14 0400  piperacillin-tazobactam (ZOSYN) IVPB 3.375 g  Status:  Discontinued     3.375 g 12.5 mL/hr over 240 Minutes Intravenous Every 8 hours 08/11/2014 2205 08/31/14 0849   08/16/2014 1945  piperacillin-tazobactam (ZOSYN) IVPB 3.375 g     3.375 g 100 mL/hr over 30 Minutes Intravenous  Once 08/11/2014 1932 08/24/2014 2032   08/20/2014 1945  vancomycin (VANCOCIN) IVPB 1000 mg/200 mL premix     1,000 mg 200 mL/hr over 60 Minutes Intravenous  Once 08/25/2014 1932 08/31/2014 2205      Medications   Scheduled Meds: . aspirin EC  81 mg Oral Daily  . baclofen  10 mg Oral TID  . calcium-vitamin D  2 tablet Oral BID  . cefTRIAXone (ROCEPHIN) IVPB 1 gram/50 mL D5W  1 g Intravenous Q24H  . docusate sodium  200 mg Oral BID  . feeding supplement (ENSURE ENLIVE)  237 mL Oral BID BM  . fentaNYL  100 mcg Transdermal Q72H  . fentaNYL  25 mcg Transdermal Q72H  . folic acid  1 mg Oral Daily  . gabapentin  100 mg Oral TID  . heparin  5,000 Units Subcutaneous 3 times per day  . insulin aspart  0-9 Units Subcutaneous TID WC  . mometasone-formoterol  2 puff Inhalation BID  . pantoprazole  40 mg Oral Daily  . rosuvastatin  10 mg Oral Daily  . senna-docusate  1 tablet Oral BID  . vancomycin  750 mg Intravenous Q24H   Continuous Infusions: . sodium chloride 100 mL/hr at 08/31/14 0757   PRN Meds:.acetaminophen, LORazepam, metoCLOPramide, morphine injection, oxyCODONE   Data Review:   Micro Results Recent Results (from the past 240 hour(s))  Urine culture     Status: None (Preliminary result)   Collection Time: 09/02/2014  9:27 PM  Result Value Ref Range Status   Specimen Description URINE, CLEAN CATCH  Final   Special Requests NONE  Final  Culture NO GROWTH < 24 HOURS  Final   Report Status PENDING  Incomplete    Radiology Reports US Venous Img Lower Unilateral Left  08/26/2014   CLINICAL DATA:  Left leg PAIN, edema. Lung carcinoma with metastases.  EXAM: LEFT LOWER EXTREMITY VENOUS DOPPLER  ULTRASOUND  TECHNIQUE: Gray-scale sonography with compression, as well as color and duplex ultrasound, were performed to evaluate the deep venous system from the level of the common femoral vein through the popliteal and proximal calf veins.  COMPARISON:  04/03/2014  FINDINGS: Normal compressibility of the common femoral, superficial femoral, and popliteal veins, as well as the proximal calf veins. No filling defects to suggest DVT on grayscale or color Doppler imaging. Doppler waveforms show normal direction of venous flow, normal respiratory phasicity and response to augmentation. Survey views of the contralateral common femoral vein are unremarkable.  IMPRESSION: 1. No evidence of lower extremity deep vein thrombosis, LEFT.   Electronically Signed   By: Lucrezia Europe M.D.   On: 08/26/2014 20:31   Dg Chest Port 1 View  08/15/2014   CLINICAL DATA:  Pt here for abdominal pain, back pain and leg pain. Pt vomiting in triage room. Pt last chemo tx was Friday morning, pt being treated for bone and lung cancer. Pt with fever. H/o COPD  EXAM: PORTABLE CHEST - 1 VIEW  COMPARISON:  CT 07/11/2014 and earlier studies  FINDINGS: Right IJ port catheter to the low SVC. Lungs clear. Stable right hilar fullness. Heart size upper limits normal for technique. Atheromatous aortic arch. No pneumothorax. No effusion. Spondylitic changes in the mid and lower thoracic spine.  IMPRESSION: No acute cardiopulmonary disease.   Electronically Signed   By: Lucrezia Europe M.D.   On: 09/10/2014 20:15     CBC  Recent Labs Lab 08/25/14 0900 08/22/2014 1006 08/19/2014 1951 08/30/14 0532  WBC 4.4 7.7 8.1 6.6  HGB 8.8* 8.2* 8.2* 7.8*  HCT 27.0* 25.6* 25.0* 24.0*  PLT 166 131* 121* 106*  MCV 88.9 89.9 89.1 90.1  MCH 29.0 28.8 29.1 29.5  MCHC 32.6 32.0 32.7 32.7  RDW 21.5* 21.1* 20.7* 20.7*  LYMPHSABS 0.3* 0.1* 0.2*  --   MONOABS 0.6 0.1* 0.1*  --   EOSABS 0.0 0.0 0.0  --   BASOSABS 0.0 0.0 0.0  --     Chemistries   Recent Labs Lab  08/25/14 0900 08/21/2014 1951 08/30/14 0532  NA  --  144 144  K  --  4.2 4.2  CL  --  104 105  CO2  --  28 30  GLUCOSE  --  152* 141*  BUN  --  26* 26*  CREATININE 1.64* 1.79* 1.77*  CALCIUM 6.4* 6.0* 5.8*  AST  --  25  --   ALT  --  11*  --   ALKPHOS  --  69  --   BILITOT  --  1.2  --    ------------------------------------------------------------------------------------------------------------------ estimated creatinine clearance is 32.6 mL/min (by C-G formula based on Cr of 1.77). ------------------------------------------------------------------------------------------------------------------ No results for input(s): HGBA1C in the last 72 hours. ------------------------------------------------------------------------------------------------------------------ No results for input(s): CHOL, HDL, LDLCALC, TRIG, CHOLHDL, LDLDIRECT in the last 72 hours. ------------------------------------------------------------------------------------------------------------------ No results for input(s): TSH, T4TOTAL, T3FREE, THYROIDAB in the last 72 hours.  Invalid input(s): FREET3 ------------------------------------------------------------------------------------------------------------------ No results for input(s): VITAMINB12, FOLATE, FERRITIN, TIBC, IRON, RETICCTPCT in the last 72 hours.  Coagulation profile No results for input(s): INR, PROTIME in the last 168 hours.  No results for input(s): DDIMER in  the last 72 hours.  Cardiac Enzymes No results for input(s): CKMB, TROPONINI, MYOGLOBIN in the last 168 hours.  Invalid input(s): CK ------------------------------------------------------------------------------------------------------------------ Invalid input(s): POCBNP    Assessment & Plan   * Fever Due to cellulitis, on lower abdominal wall and upper thighs. Continue vancomycin  Changed zosyn to ceftriaxone due to ecoli resistent  worst overnight Cancer also playing role  in fever  * Lung cancer with metastasis On chemotherapy, last chemotherapy was 5 days ago Seen by oncology continue current care   * Diabetes Blood glucose are stable continue sliding scale insulin  * Chronic renal failure Monitor  * Hypocalcemia Hypo-albuminemia start calcium supplements, check corrected calcium  * COPD Continue dulara     Code Status Orders        Start     Ordered   08/13/2014 2248  Full code   Continuous     08/11/2014 2247    Advance Directive Documentation        Most Recent Value   Type of Advance Directive  Healthcare Power of Attorney   Pre-existing out of facility DNR order (yellow form or pink MOST form)     "MOST" Form in Place?        D/w faimily and patient prognosis poor if no improvement would consider comfort care measures     Consults   oncology  DVT Prophylaxis  heparin  Lab Results  Component Value Date   PLT 106* 08/30/2014     Time Spent in minutes   56mn     PDustin FlockM.D on 08/31/2014 at 8:56 AM  Between 7am to 6pm - Pager - [phone]  After 6pm go to [email or web address] - password [email or web address]   Office  [phone]

## 2014-08-31 NOTE — Plan of Care (Signed)
Problem: Phase III Progression Outcomes Goal: Other Phase III Outcomes/Goals Outcome: Progressing Plan of care progress to goal for: 1. Pain-pt c/o pain this shift, prn meds given with improvement 2. Hemodynamically-             -pt febrile this shift             -IV fluids continue per order             -IV ABT continues per order 3. Complications-altered mental status, not at base line this shift, pt not swallowing 4. Diet-pt not swallowing this shift 5. Activity-pt not out of bed this shift

## 2014-08-31 NOTE — Progress Notes (Signed)
   08/31/14 1400  Clinical Encounter Type  Visited With Patient and family together  Visit Type Initial  Consult/Referral To Chaplain  Spiritual Encounters  Spiritual Needs Prayer  Visited with patient and family.  Provided prayer, pastoral presence, compassion and support to patient and family.  [place] [phone]

## 2014-08-31 NOTE — Progress Notes (Signed)
Spoke with Dr. Reece Levy about pt's low BP.  NS 260m bolus ordered. Will continue to monitor.  DClarise Cruz RN

## 2014-08-31 NOTE — Progress Notes (Signed)
   08/31/14 2041  Vitals  Temp (!) 101.8 F (38.8 C)  Dr. Marcille Blanco notified, ordered to give tylenol supp early as ordered and have fan at bedside.  Edison Simon provided for pt

## 2014-08-31 NOTE — Progress Notes (Signed)
ANTIBIOTIC CONSULT NOTE - Follow up  Pharmacy Consult for Vancomycin Indication: Cellulitis/r/o sepsis  Allergies  Allergen Reactions  . Quinapril Hcl Nausea And Vomiting    Patient Measurements: Height: '5\' 3"'$  (160 cm) Weight: 200 lb 9.6 oz (90.992 kg) IBW/kg (Calculated) : 52.4 Adjusted Body Weight: 67.8 kg  Vital Signs: Temp: 99.2 F (37.3 C) (07/21 0803) Temp Source: Oral (07/21 0803) BP: 87/45 mmHg (07/21 0803) Pulse Rate: 82 (07/21 0803) Intake/Output from previous day: 07/20 0701 - 07/21 0700 In: 0092 [P.O.:720; I.V.:495] Out: 350 [Urine:350] Intake/Output from this shift:    Labs:  Recent Labs  09/04/2014 1006 08/28/2014 1951 08/30/14 0532  WBC 7.7 8.1 6.6  HGB 8.2* 8.2* 7.8*  PLT 131* 121* 106*  CREATININE  --  1.79* 1.77*   Estimated Creatinine Clearance: 32.6 mL/min (by C-G formula based on Cr of 1.77). No results for input(s): VANCOTROUGH, VANCOPEAK, VANCORANDOM, GENTTROUGH, GENTPEAK, GENTRANDOM, TOBRATROUGH, TOBRAPEAK, TOBRARND, AMIKACINPEAK, AMIKACINTROU, AMIKACIN in the last 72 hours.   Microbiology: Recent Results (from the past 720 hour(s))  Urine culture     Status: None (Preliminary result)   Collection Time: 08/29/2014  9:27 PM  Result Value Ref Range Status   Specimen Description URINE, CLEAN CATCH  Final   Special Requests NONE  Final   Culture NO GROWTH < 24 HOURS  Final   Report Status PENDING  Incomplete    Medical History: Past Medical History  Diagnosis Date  . COPD (chronic obstructive pulmonary disease)   . Diabetes mellitus without complication   . Lung cancer     Medications:  Scheduled:  . aspirin EC  81 mg Oral Daily  . baclofen  10 mg Oral TID  . calcium-vitamin D  2 tablet Oral BID  . cefTRIAXone (ROCEPHIN) IVPB 1 gram/50 mL D5W  1 g Intravenous Q24H  . docusate sodium  200 mg Oral BID  . feeding supplement (ENSURE ENLIVE)  237 mL Oral BID BM  . fentaNYL  100 mcg Transdermal Q72H  . fentaNYL  25 mcg Transdermal Q72H   . folic acid  1 mg Oral Daily  . gabapentin  100 mg Oral TID  . heparin  5,000 Units Subcutaneous 3 times per day  . insulin aspart  0-9 Units Subcutaneous TID WC  . mometasone-formoterol  2 puff Inhalation BID  . pantoprazole  40 mg Oral Daily  . rosuvastatin  10 mg Oral Daily  . senna-docusate  1 tablet Oral BID  . vancomycin  750 mg Intravenous Q24H   Infusions:  . sodium chloride 100 mL/hr at 08/31/14 0757   PRN:   Assessment: 69 y/o F on chemotherapy admitted with fever to begin abx for possible cellulitis r/o sepsis.  Vancomycin 1000 mg iv once given in ED  Goal of Therapy:  Vancomycin trough level 15-20 mcg/ml  Plan:  Will continue vancomycin 750 mg iv q 24 hours and check a trough with the 4th dose (7/23 at 0430).  Will f/u renal function and culture results  SCr ordered for tomorrow AM at 0400 to follow up on renal function.  Rayna Sexton L 08/31/2014,9:00 AM

## 2014-09-01 LAB — CBC
HEMATOCRIT: 20.3 % — AB (ref 35.0–47.0)
Hemoglobin: 6.5 g/dL — ABNORMAL LOW (ref 12.0–16.0)
MCH: 28.9 pg (ref 26.0–34.0)
MCHC: 32.3 g/dL (ref 32.0–36.0)
MCV: 89.5 fL (ref 80.0–100.0)
PLATELETS: 50 10*3/uL — AB (ref 150–440)
RBC: 2.26 MIL/uL — AB (ref 3.80–5.20)
RDW: 21 % — AB (ref 11.5–14.5)
WBC: 0.8 10*3/uL — CL (ref 3.6–11.0)

## 2014-09-01 LAB — BASIC METABOLIC PANEL
ANION GAP: 11 (ref 5–15)
BUN: 40 mg/dL — AB (ref 6–20)
CALCIUM: 5.3 mg/dL — AB (ref 8.9–10.3)
CO2: 25 mmol/L (ref 22–32)
Chloride: 108 mmol/L (ref 101–111)
Creatinine, Ser: 3.06 mg/dL — ABNORMAL HIGH (ref 0.44–1.00)
GFR calc non Af Amer: 15 mL/min — ABNORMAL LOW (ref >60)
GFR, EST AFRICAN AMERICAN: 17 mL/min — AB (ref >60)
Glucose, Bld: 108 mg/dL — ABNORMAL HIGH (ref 65–99)
POTASSIUM: 4.4 mmol/L (ref 3.5–5.1)
Sodium: 144 mmol/L (ref 135–145)

## 2014-09-01 LAB — CALCIUM, IONIZED: Calcium, Ionized, Serum: 3.1 mg/dL — ABNORMAL LOW (ref 4.5–5.6)

## 2014-09-01 LAB — MAGNESIUM: Magnesium: 1.9 mg/dL

## 2014-09-01 LAB — GLUCOSE, CAPILLARY: Glucose-Capillary: 120 mg/dL — ABNORMAL HIGH (ref 65–99)

## 2014-09-01 LAB — ALBUMIN: Albumin: 1.6 g/dL — ABNORMAL LOW (ref 3.5–5.0)

## 2014-09-01 MED ORDER — MAGIC MOUTHWASH
15.0000 mL | Freq: Four times a day (QID) | ORAL | Status: DC | PRN
Start: 2014-09-01 — End: 2014-09-05

## 2014-09-01 MED ORDER — MORPHINE SULFATE (CONCENTRATE) 10 MG/0.5ML PO SOLN: 5.0000 mg | ORAL | Status: DC | PRN

## 2014-09-01 MED ORDER — GLYCOPYRROLATE 0.2 MG/ML IJ SOLN
0.1000 mg | Freq: Once | INTRAMUSCULAR | Status: AC
  Administered 2014-09-01: 0.1 mg via INTRAVENOUS
  Filled 2014-09-01: qty 1

## 2014-09-01 MED ORDER — SODIUM CHLORIDE 0.9 % IJ SOLN: 3.0000 mL | INTRAMUSCULAR | Status: DC | PRN

## 2014-09-01 MED ORDER — SODIUM CHLORIDE 0.9 % IV SOLN: 250.0000 mL | INTRAVENOUS | Status: DC | PRN

## 2014-09-01 MED ORDER — ACETAMINOPHEN 650 MG RE SUPP: 650.0000 mg | Freq: Four times a day (QID) | RECTAL | Status: DC | PRN

## 2014-09-01 MED ORDER — LORAZEPAM 2 MG/ML IJ SOLN
2.0000 mg | INTRAMUSCULAR | Status: DC | PRN
  Administered 2014-09-01 – 2014-09-05 (×4): 2 mg via INTRAVENOUS
  Filled 2014-09-01 (×4): qty 1

## 2014-09-01 MED ORDER — SODIUM CHLORIDE 0.9 % IJ SOLN: 3.0000 mL | Freq: Two times a day (BID) | INTRAMUSCULAR | Status: DC

## 2014-09-01 MED ORDER — MORPHINE SULFATE 2 MG/ML IJ SOLN: 2.0000 mg | INTRAMUSCULAR | Status: DC | PRN

## 2014-09-01 MED ORDER — ACETAMINOPHEN 325 MG PO TABS: 650.0000 mg | ORAL_TABLET | Freq: Four times a day (QID) | ORAL | Status: DC | PRN

## 2014-09-01 NOTE — Progress Notes (Signed)
Nutrition Brief Note  Chart reviewed. Pt now transitioning to comfort care.  No further nutrition interventions warranted at this time.  Please re-consult as needed.   Inocencio Roy B. Zenia Resides, [place], [place] (pager)

## 2014-09-01 NOTE — Plan of Care (Signed)
Problem: Discharge Progression Outcomes Goal: Discharge plan in place and appropriate Individualization of Care Pt prefers to be called Sharon Duran Pt with history of known history of diabetes, COPD and lung cancer with metastasis, who had last chemotherapy 5 days ago, on home medications HIGH fall precautions    Goal: Other Discharge Outcomes/Goals Plan of Care Progress to Goal: , Pt with increased temp this shift, TMAX 101.8, tylenol given per orders, fan at bedside for comfort, family at bedside, pt given morphine for comfort, turned q 2 hours.

## 2014-09-01 NOTE — Progress Notes (Signed)
[facility]                                                                                                                                                                                            Patient Demographics   Sharon Duran, is a 69 y.o. female, DOB - 06-04-1945, UUV:[id number]  Admit date - 08/27/2014   Admitting Physician Vaughan Basta, MD  Outpatient Primary MD for the patient is [facility], MD   LOS - 3  Subjective:  Pt continues to have pain, still febrile, wbc drop, hgb drop, plat dropped   Review of Systems:    pt sleepy unable to provide ros  Vitals:   Filed Vitals:   08/31/14 1844 08/31/14 2041 09/01/14 0032 09/01/14 0349  BP: 111/43 108/67  94/43  Pulse: 100 82  89  Temp: 100.9 F (38.3 C) 101.8 F (38.8 C) 99.8 F (37.7 C) 100.3 F (37.9 C)  TempSrc: Oral Axillary Axillary Axillary  Resp:  20  20  Height:      Weight:      SpO2:  100%  100%    Wt Readings from Last 3 Encounters:  09/02/2014 90.992 kg (200 lb 9.6 oz)  08/25/14 91.4 kg (201 lb 8 oz)  08/22/14 91.9 kg (202 lb 9.6 oz)     Intake/Output Summary (Last 24 hours) at 09/01/14 0854 Last data filed at 08/31/14 1938  Gross per 24 hour  Intake   1725 ml  Output    575 ml  Net   1150 ml    Physical Exam:   GENERAL: Pleasant-appearing chronically ill HEAD, EYES, EARS, NOSE AND THROAT: Atraumatic, normocephalic. Extraocular muscles are intact. Pupils equal and reactive to light. Sclerae anicteric. No conjunctival injection. No oro-pharyngeal erythema.  NECK: Supple. There is no jugular venous distention. No bruits, no lymphadenopathy, no thyromegaly.  HEART: Regular rate and rhythm, tachycardic. No murmurs, no rubs, no clicks.  LUNGS: Clear to auscultation bilaterally. No rales or rhonchi. No wheezes.  ABDOMEN: Soft, flat, nontender, nondistended. Has good bowel sounds. No hepatosplenomegaly appreciated.  EXTREMITIES: No  evidence of any cyanosis, clubbing, or peripheral edema.  +2 pedal and radial pulses bilaterally.  NEUROLOGIC: The patient is alert, awake, and oriented x3 with no focal motor or sensory deficits appreciated bilaterally.  SKIN: Moist and warm with no rashes appreciated. wormth in groin and abdomen Psych: Not anxious, depressed LN: No inguinal LN enlargement    Antibiotics   Anti-infectives    Start     Dose/Rate Route Frequency Ordered Stop   08/31/14 1000  cefTRIAXone (ROCEPHIN) 1 g in  dextrose 5 % 50 mL IVPB  Status:  Discontinued    Comments:  Indication: UTI   1 g 100 mL/hr over 30 Minutes Intravenous Every 24 hours 08/31/14 0851 09/01/14 0852   08/31/14 0900  cefTRIAXone (ROCEPHIN) 1 g in dextrose 5 % 50 mL IVPB - Premix  Status:  Discontinued     1 g 100 mL/hr over 30 Minutes Intravenous Every 24 hours 08/31/14 0849 08/31/14 0850   08/30/14 0500  vancomycin (VANCOCIN) 500 mg in sodium chloride 0.9 % 100 mL IVPB  Status:  Discontinued     500 mg 100 mL/hr over 60 Minutes Intravenous Every 24 hours 09/04/2014 2205 08/13/2014 2207   08/30/14 0500  vancomycin (VANCOCIN) IVPB 750 mg/150 ml premix  Status:  Discontinued     750 mg 150 mL/hr over 60 Minutes Intravenous Every 24 hours 08/11/2014 2207 09/01/14 0852   08/30/14 0400  piperacillin-tazobactam (ZOSYN) IVPB 3.375 g  Status:  Discontinued     3.375 g 12.5 mL/hr over 240 Minutes Intravenous Every 8 hours 09/09/2014 2205 08/31/14 0849   09/01/2014 1945  piperacillin-tazobactam (ZOSYN) IVPB 3.375 g     3.375 g 100 mL/hr over 30 Minutes Intravenous  Once 09/02/2014 1932 08/30/2014 2032   08/14/2014 1945  vancomycin (VANCOCIN) IVPB 1000 mg/200 mL premix     1,000 mg 200 mL/hr over 60 Minutes Intravenous  Once 08/28/2014 1932 08/12/2014 2205      Medications   Scheduled Meds: . aspirin EC  81 mg Oral Daily  . baclofen  10 mg Oral TID  . feeding supplement (ENSURE ENLIVE)  237 mL Oral BID BM  . fentaNYL  100 mcg Transdermal Q72H  . fentaNYL   25 mcg Transdermal Q72H  . gabapentin  100 mg Oral TID   Continuous Infusions:   PRN Meds:.acetaminophen, LORazepam, LORazepam, morphine injection, oxyCODONE   Data Review:   Micro Results Recent Results (from the past 240 hour(s))  Urine culture     Status: None   Collection Time: 08/21/2014  9:27 PM  Result Value Ref Range Status   Specimen Description URINE, CLEAN CATCH  Final   Special Requests NONE  Final   Culture NO GROWTH 2 DAYS  Final   Report Status 08/31/2014 FINAL  Final    Radiology Reports US Venous Img Lower Unilateral Left  09/03/2014   CLINICAL DATA:  Left leg PAIN, edema. Lung carcinoma with metastases.  EXAM: LEFT LOWER EXTREMITY VENOUS DOPPLER ULTRASOUND  TECHNIQUE: Gray-scale sonography with compression, as well as color and duplex ultrasound, were performed to evaluate the deep venous system from the level of the common femoral vein through the popliteal and proximal calf veins.  COMPARISON:  04/03/2014  FINDINGS: Normal compressibility of the common femoral, superficial femoral, and popliteal veins, as well as the proximal calf veins. No filling defects to suggest DVT on grayscale or color Doppler imaging. Doppler waveforms show normal direction of venous flow, normal respiratory phasicity and response to augmentation. Survey views of the contralateral common femoral vein are unremarkable.  IMPRESSION: 1. No evidence of lower extremity deep vein thrombosis, LEFT.   Electronically Signed   By: Lucrezia Europe M.D.   On: 09/02/2014 20:31   Dg Chest Port 1 View  08/17/2014   CLINICAL DATA:  Pt here for abdominal pain, back pain and leg pain. Pt vomiting in triage room. Pt last chemo tx was Friday morning, pt being treated for bone and lung cancer. Pt with fever. H/o COPD  EXAM: PORTABLE CHEST -  1 VIEW  COMPARISON:  CT 07/11/2014 and earlier studies  FINDINGS: Right IJ port catheter to the low SVC. Lungs clear. Stable right hilar fullness. Heart size upper limits normal for  technique. Atheromatous aortic arch. No pneumothorax. No effusion. Spondylitic changes in the mid and lower thoracic spine.  IMPRESSION: No acute cardiopulmonary disease.   Electronically Signed   By: Lucrezia Europe M.D.   On: 08/31/2014 20:15     CBC  Recent Labs Lab 08/25/14 0900 08/11/2014 1006 09/04/2014 1951 08/30/14 0532 09/01/14 0505  WBC 4.4 7.7 8.1 6.6 0.8*  HGB 8.8* 8.2* 8.2* 7.8* 6.5*  HCT 27.0* 25.6* 25.0* 24.0* 20.3*  PLT 166 131* 121* 106* 50*  MCV 88.9 89.9 89.1 90.1 89.5  MCH 29.0 28.8 29.1 29.5 28.9  MCHC 32.6 32.0 32.7 32.7 32.3  RDW 21.5* 21.1* 20.7* 20.7* 21.0*  LYMPHSABS 0.3* 0.1* 0.2*  --   --   MONOABS 0.6 0.1* 0.1*  --   --   EOSABS 0.0 0.0 0.0  --   --   BASOSABS 0.0 0.0 0.0  --   --     Chemistries   Recent Labs Lab 08/25/14 0900 08/27/2014 1951 08/30/14 0532 09/01/14 0505  NA  --  144 144 144  K  --  4.2 4.2 4.4  CL  --  104 105 108  CO2  --  '28 30 25  '$ GLUCOSE  --  152* 141* 108*  BUN  --  26* 26* 40*  CREATININE 1.64* 1.79* 1.77* 3.06*  CALCIUM 6.4* 6.0* 5.8* 5.3*  AST  --  25  --   --   ALT  --  11*  --   --   ALKPHOS  --  69  --   --   BILITOT  --  1.2  --   --    ------------------------------------------------------------------------------------------------------------------ estimated creatinine clearance is 18.8 mL/min (by C-G formula based on Cr of 3.06). ------------------------------------------------------------------------------------------------------------------ No results for input(s): HGBA1C in the last 72 hours. ------------------------------------------------------------------------------------------------------------------ No results for input(s): CHOL, HDL, LDLCALC, TRIG, CHOLHDL, LDLDIRECT in the last 72 hours. ------------------------------------------------------------------------------------------------------------------ No results for input(s): TSH, T4TOTAL, T3FREE, THYROIDAB in the last 72 hours.  Invalid input(s):  FREET3 ------------------------------------------------------------------------------------------------------------------ No results for input(s): VITAMINB12, FOLATE, FERRITIN, TIBC, IRON, RETICCTPCT in the last 72 hours.  Coagulation profile No results for input(s): INR, PROTIME in the last 168 hours.  No results for input(s): DDIMER in the last 72 hours.  Cardiac Enzymes No results for input(s): CKMB, TROPONINI, MYOGLOBIN in the last 168 hours.  Invalid input(s): CK ------------------------------------------------------------------------------------------------------------------ Invalid input(s): POCBNP    Assessment & Plan   * Fever Due to cellulitis, on lower abdominal wall and upper thighs.  pt made comfort care * Lung cancer with metastasis On chemotherapy, last chemotherapy was 5 days ago Comfort care  * Diabetes Blood glucose are stable continue sliding scale insulin  * Chronic renal failure Monitor  * Hypocalcemia  comfort care measures  * COPD Continue dulara     Code Status Orders        Start     Ordered   08/31/2014 2248  Full code   Continuous     08/28/2014 2247    Advance Directive Documentation        Most Recent Value   Type of Advance Directive  Healthcare Power of Attorney   Pre-existing out of facility DNR order (yellow form or pink MOST form)     "MOST" Form in Place?  D/w faimily  Son and daughter, agrreable to comfort care measures only     Consults   oncology  DVT Prophylaxis  heparin  Lab Results  Component Value Date   PLT 50* 09/01/2014     Time Spent in minutes   43mn     PDustin FlockM.D on 09/01/2014 at 8:54 AM  Between 7am to 6pm - Pager - [phone]  After 6pm go to [email or web address] - password [email or web address]   Office  [phone]

## 2014-09-01 NOTE — Plan of Care (Signed)
Problem: Discharge Progression Outcomes Goal: Other Discharge Outcomes/Goals Plan of care progress to goal:  Comfort care measures only. Patient not able to tolerate PO medications at this time. IV Morphine given once for pain, relief noted. IV Ativan given once for anxiety, patient resting comfortably since dose given. Family remains at bedside. Patient repositioned per request.

## 2014-09-01 NOTE — Progress Notes (Signed)
Lab called report of Calcium at 5.3 and WBC of 0.8.  Dr. Reece Levy notified, orders received to add Albumin to labs this am to get a corrected calcium and place pt on neutropenic precautions. Donnita Falls, RN 09/01/2014. 6:02 AM

## 2014-09-02 MED ORDER — GLYCOPYRROLATE 0.2 MG/ML IJ SOLN
0.4000 mg | INTRAMUSCULAR | Status: DC | PRN
  Administered 2014-09-02 – 2014-09-05 (×2): 0.4 mg via INTRAVENOUS
  Filled 2014-09-02 (×3): qty 2

## 2014-09-02 MED ORDER — SODIUM CHLORIDE 0.9 % IV SOLN: 3.0000 mg/h | INTRAVENOUS | Status: DC

## 2014-09-02 MED ORDER — MORPHINE 100MG IN NS 100ML (1MG/ML) PREMIX INFUSION
7.5000 mg/h | INTRAVENOUS | Status: DC
  Administered 2014-09-02: 11:00:00 3 mg/h via INTRAVENOUS
  Administered 2014-09-03 – 2014-09-04 (×3): 5 mg/h via INTRAVENOUS
  Administered 2014-09-05: 14:00:00 7.5 mg/h via INTRAVENOUS
  Filled 2014-09-02 (×5): qty 100

## 2014-09-02 MED ORDER — SCOPOLAMINE 1 MG/3DAYS TD PT72
1.0000 | MEDICATED_PATCH | TRANSDERMAL | Status: DC
  Administered 2014-09-02: 1.5 mg via TRANSDERMAL
  Filled 2014-09-02 (×2): qty 1

## 2014-09-02 MED ORDER — MORPHINE SULFATE 4 MG/ML IJ SOLN: 4.0000 mg | INTRAMUSCULAR | Status: DC | PRN

## 2014-09-02 NOTE — Plan of Care (Signed)
Problem: Discharge Progression Outcomes Goal: Other Discharge Outcomes/Goals Outcome: Not Applicable Date Met:  03/79/55 Patient remains on comfort care, started on continuous morphine drip per family request, Patient appears comfortable.  Large pleasant family at bedside.

## 2014-09-02 NOTE — Progress Notes (Signed)
No change with pts condition at this time. Scopolamine patch ordered and placed behind right ear. Pt remains on IV morphine at '3mg'$ /hr. Family remains at bedside at all times, comfort and support provided. Will continue to monitor.

## 2014-09-02 NOTE — Plan of Care (Signed)
Problem: Discharge Progression Outcomes Goal: Other Discharge Outcomes/Goals Plan of care progress to goal:  Individualization of Care  Comfort care measures only. Patient unresponsive this shift  PO medications D/C by physician. . IV Ativan given once for anxiety, patient resting comfortably since dose given. Family remains at bedside. Patient repositioned every two hours.

## 2014-09-02 NOTE — Progress Notes (Signed)
Patient ID: Sharon Duran, female   DOB: 03-Jan-1946, 68 y.o.   MRN: [id number] [facility] PROGRESS NOTE  PCP: [facility], MD  HPI/Subjective: Patient opens eyes to sternal rub. Family thinks the patient has been comfortable.  Objective: Filed Vitals:   09/01/14 1824  BP: 104/54  Pulse:   Temp: 100.1 F (37.8 C)  Resp:     Filed Weights   09/08/2014 1841 09/03/2014 2312  Weight: 86.183 kg (190 lb) 90.992 kg (200 lb 9.6 oz)    ROS: Review of Systems  Unable to perform ROS  secondary to unresponsiveness  Exam: Physical Exam  HENT:  Head: Normocephalic.  Nose: No mucosal edema.  Eyes: Conjunctivae and lids are normal. Left pupil is not reactive.  Neck: No JVD present. Carotid bruit is not present. No edema present. No thyroid mass and no thyromegaly present.  Cardiovascular: S1 normal and S2 normal.  Exam reveals no gallop.   Murmur heard.  Systolic murmur is present with a grade of 2/6  Pulses:      Dorsalis pedis pulses are 2+ on the right side, and 2+ on the left side.  Respiratory: No respiratory distress. She has decreased breath sounds in the right lower field and the left lower field. She has no wheezes. She has no rhonchi. She has no rales.  GI: Soft. Bowel sounds are normal. There is no tenderness.  Musculoskeletal:       Right ankle: She exhibits swelling.       Left ankle: She exhibits swelling.  Lymphadenopathy:    She has no cervical adenopathy.  Neurological: She is unresponsive.  Opens eyes to sternal rub only.  Skin: Skin is warm. Nails show no clubbing.  Bilateral lower extremity darkened discoloration and increased warmth.  Psychiatric:  Opens eyes to sternal rub only.    Data Reviewed: Basic Metabolic Panel:  Recent Labs Lab 08/26/2014 1951 08/30/14 0532 09/01/14 0505  NA 144 144 144  K 4.2 4.2 4.4  CL 104 105 108  CO2 '28 30 25  '$ GLUCOSE 152* 141* 108*  BUN 26* 26* 40*  CREATININE 1.79* 1.77* 3.06*  CALCIUM 6.0* 5.8*  5.3*   Liver Function Tests:  Recent Labs Lab 08/28/2014 1951 09/01/14 0505  AST 25  --   ALT 11*  --   ALKPHOS 69  --   BILITOT 1.2  --   PROT 6.1*  --   ALBUMIN 2.1* 1.6*   CBC:  Recent Labs Lab 09/10/2014 1006 08/19/2014 1951 08/30/14 0532 09/01/14 0505  WBC 7.7 8.1 6.6 0.8*  NEUTROABS 7.5* 7.9*  --   --   HGB 8.2* 8.2* 7.8* 6.5*  HCT 25.6* 25.0* 24.0* 20.3*  MCV 89.9 89.1 90.1 89.5  PLT 131* 121* 106* 50*    CBG:  Recent Labs Lab 08/31/14 0703 08/31/14 1115 08/31/14 1614 08/31/14 2113 09/01/14 0718  GLUCAP 116* 101* 100* 106* 120*    Scheduled Meds: . fentaNYL  100 mcg Transdermal Q72H  . fentaNYL  25 mcg Transdermal Q72H    Assessment/Plan:  1. 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 Patient has been made comfort care and is a DO NOT RESUSCITATE. Family did not want a morphine drip at this point. When necessary IV morphine and sublingual Roxanol, when necessary Ativan and glycopyrrolate. 2. Fever, cellulitis bilateral lower extremities.  Antibiotics stopped. 3. 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 4. Hypocalcemia. 5. Type 2 diabetes without complication. 6. COPD  Code Status:     Code  Status Orders        Start     Ordered   09/01/14 0900  Do not attempt resuscitation (DNR)   Continuous    Question Answer Comment  In the event of cardiac or respiratory ARREST Do not call a "code blue"   In the event of cardiac or respiratory ARREST Do not perform Intubation, CPR, defibrillation or ACLS   In the event of cardiac or respiratory ARREST Use medication by any route, position, wound care, and other measures to relive pain and suffering. May use oxygen, suction and manual treatment of airway obstruction as needed for comfort.      09/01/14 0900    Advance Directive Documentation        Most Recent Value   Type of Advance Directive  Healthcare Power of Attorney   Pre-existing out of facility DNR order (yellow form or  pink MOST form)     "MOST" Form in Place?       Family Communication: Family at bedside. Disposition Plan: To be determined.  Time spent: 22 minutes.  Loletha Grayer  [facility] [place] [facility]

## 2014-09-03 LAB — CULTURE, BLOOD (ROUTINE X 2)
Culture: NO GROWTH
Culture: NO GROWTH

## 2014-09-03 NOTE — Progress Notes (Signed)
Patient ID: Sharon Duran, female   DOB: 1946-01-01, 69 y.o.   MRN: [id number] [facility] PROGRESS NOTE  PCP: [facility], MD  HPI/Subjective: As per family, patient started with the heavier breathing last night around 10 PM. They state that the patient opens up her eyes when she is moved. Foley catheter needed to be placed for urinary retention.   Objective: Filed Vitals:   09/03/14 0435  BP: 79/53  Pulse: 117  Temp: 98.9 F (37.2 C)  Resp: 9    Filed Weights   08/16/2014 1841 08/28/2014 2312  Weight: 86.183 kg (190 lb) 90.992 kg (200 lb 9.6 oz)    ROS: Review of Systems  Unable to perform ROS  secondary to unresponsiveness  Exam: Physical Exam  HENT:  Head: Normocephalic.  Nose: No mucosal edema.  Eyes: Conjunctivae and lids are normal. Right pupil is not reactive. Left pupil is not reactive.  Neck: No JVD present. Carotid bruit is not present. No edema present. No thyroid mass and no thyromegaly present.  Cardiovascular: S1 normal and S2 normal.  Exam reveals no gallop.   Murmur heard.  Systolic murmur is present with a grade of 2/6  Pulses:      Dorsalis pedis pulses are 2+ on the right side, and 2+ on the left side.  Respiratory: Accessory muscle usage present. Apnea noted. She has decreased breath sounds in the right middle field, the right lower field, the left middle field and the left lower field. She has no wheezes. She has rhonchi in the right lower field and the left lower field. She has no rales.  GI: Soft. Bowel sounds are normal. There is no tenderness.  Musculoskeletal:       Right ankle: She exhibits swelling.       Left ankle: She exhibits swelling.  Lymphadenopathy:    She has no cervical adenopathy.  Neurological: She is unresponsive.  Tries to open eyes to my voice.  Skin: Skin is warm. Nails show no clubbing.  Bilateral lower extremity darkened discoloration and increased warmth.  Psychiatric:  Tries to open eyes to command.     Assessment/Plan:  1. 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 Patient has been made comfort care and is a DO NOT RESUSCITATE. Increase morphine drip to 5 mg/h with when necessary IV morphine pushes and when necessary Ativan and glycopyrrolate. Scopolamine patch also added yesterday. Patient with periods of apnea and agonal-type breathing. I don't expect the patient to survive more than 24 hours. 2. Acute encephalopathy. 3. Fever, cellulitis bilateral lower extremities.  Antibiotics stopped. 4. 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 5. Hypocalcemia. 6. Type 2 diabetes without complication. 7. COPD  Code Status:     Code Status Orders        Start     Ordered   09/01/14 0900  Do not attempt resuscitation (DNR)   Continuous    Question Answer Comment  In the event of cardiac or respiratory ARREST Do not call a "code blue"   In the event of cardiac or respiratory ARREST Do not perform Intubation, CPR, defibrillation or ACLS   In the event of cardiac or respiratory ARREST Use medication by any route, position, wound care, and other measures to relive pain and suffering. May use oxygen, suction and manual treatment of airway obstruction as needed for comfort.      09/01/14 0900    Advance Directive Documentation        Most Recent Value  Type of Advance Directive  Healthcare Power of Attorney   Pre-existing out of facility DNR order (yellow form or pink MOST form)     "MOST" Form in Place?       Family Communication: Family at bedside.  Time spent: 20 minutes.  Loletha Grayer  [facility]

## 2014-09-03 NOTE — Plan of Care (Signed)
Problem: Discharge Progression Outcomes Goal: Pain controlled with appropriate interventions Outcome: Progressing Patient remains mostly unresponsive, family at bedside, Patient appears comfortable increased morphine rate from 3 to 5 ml/hr.

## 2014-09-03 NOTE — Plan of Care (Signed)
Problem: Discharge Progression Outcomes Goal: Pain controlled with appropriate interventions Outcome: Progressing Patient remains on comfort care, continues morphine per order.  Large pleasant family at bedside.

## 2014-09-04 NOTE — Plan of Care (Signed)
Problem: Discharge Progression Outcomes Goal: Other Discharge Outcomes/Goals Plan of care progress to goal: 1.Patient remains mostly unresponsive, family at bedside. 2.Patient appears comfortable increased morphine rate from 74m/hr.  3.Ativan given once PRN with improvement noted.

## 2014-09-04 NOTE — Progress Notes (Signed)
Patient ID: Sharon Duran, female   DOB: [birth year], 69 y.o.   MRN: [id number]  [facility] PROGRESS NOTE  PCP: [facility], MD  HPI/Subjective: As per family, she has been unresponsive.   Objective: Filed Vitals:   09/03/14 1543  BP: 83/43  Pulse: 94  Temp:   Resp: 14    Filed Weights   08/24/2014 1841 08/17/2014 2312  Weight: 86.183 kg (190 lb) 90.992 kg (200 lb 9.6 oz)    ROS: Review of Systems  Unable to perform ROS  secondary to unresponsiveness  Exam: Physical Exam  HENT:  Head: Normocephalic.  Nose: No mucosal edema.  Eyes: Conjunctivae and lids are normal. Right pupil is not reactive. Left pupil is not reactive.  Neck: No JVD present. Carotid bruit is not present. No edema present. No thyroid mass and no thyromegaly present.  Cardiovascular: S1 normal and S2 normal.  Exam reveals no gallop.   Murmur heard.  Systolic murmur is present with a grade of 2/6  Pulses:      Dorsalis pedis pulses are 2+ on the right side, and 2+ on the left side.  Respiratory: Accessory muscle usage present. Apnea noted. She has decreased breath sounds in the right middle field, the right lower field, the left middle field and the left lower field. She has wheezes in the right middle field, the right lower field, the left middle field and the left lower field. She has no rhonchi. She has no rales.  GI: Soft. Bowel sounds are normal. There is no tenderness.  Musculoskeletal:       Right ankle: She exhibits swelling.       Left ankle: She exhibits swelling.  Lymphadenopathy:    She has no cervical adenopathy.  Neurological: She is unresponsive.  Skin: Skin is warm. Nails show no clubbing.  Bilateral lower extremity darkened discoloration and increased warmth.  Psychiatric:  Unresponsive to painful stimuli    Assessment/Plan:  1. 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 Patient has been made comfort care and is a DO NOT RESUSCITATE. Continue  morphine drip to 5 mg/h with when necessary IV morphine pushes and when necessary Ativan and glycopyrrolate. Scopolamine patch. Continue comfort care measures here in the hospital since the patient is having periods of apnea. I don't expect the patient to survive much longer. 2. Acute encephalopathy. 3. Fever, cellulitis bilateral lower extremities.  Antibiotics stopped. 4. 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 5. Hypocalcemia. 6. Type 2 diabetes without complication. 7. COPD  Code Status:     Code Status Orders        Start     Ordered   09/01/14 0900  Do not attempt resuscitation (DNR)   Continuous    Question Answer Comment  In the event of cardiac or respiratory ARREST Do not call a "code blue"   In the event of cardiac or respiratory ARREST Do not perform Intubation, CPR, defibrillation or ACLS   In the event of cardiac or respiratory ARREST Use medication by any route, position, wound care, and other measures to relive pain and suffering. May use oxygen, suction and manual treatment of airway obstruction as needed for comfort.      09/01/14 0900    Advance Directive Documentation        Most Recent Value   Type of Advance Directive  Healthcare Power of Attorney   Pre-existing out of facility DNR order (yellow form or pink MOST form)     "MOST" Form in  Place?       Family Communication: Family at bedside.  Time spent: 22 minutes.  Loletha Grayer  [facility]

## 2014-09-04 NOTE — Plan of Care (Signed)
Problem: Discharge Progression Outcomes Goal: Other Discharge Outcomes/Goals Plan of care progress to goal: 1.Patient remains mostly unresponsive, family at bedside. 2.Patient appears comfortable morphine rate from 30m/hr.   3.No PRN needed throughout the day so far. Will continue to assess.

## 2014-09-04 NOTE — Care Management Important Message (Signed)
Important Message  Patient Details  Name: SELINE ENZOR MRN: [id number] Date of Birth: [birth year]   Medicare Important Message Given:  Yes-third notification given    Sharon Duran 09/04/2014, 12:27 PM

## 2014-09-05 ENCOUNTER — Inpatient Hospital Stay: Payer: Medicare HMO

## 2014-09-06 ENCOUNTER — Telehealth: Payer: Self-pay | Admitting: *Deleted

## 2014-09-06 NOTE — Telephone Encounter (Signed)
Is out of town and would like an update on her sister's condition. I informed Ms. Reedy that I would need to check and see if she is on the list of names for release of medical information; and that I would give her a call back. She stated that she understood. 08/25/2014 at 1011~ I spoke with pt's daughter, Jeanett Schlein, and informed her that her aunt called regarding her mother's condition. She stated that spoke with her twice on yesterday; and will call again this morning. I offered my condolences to the family.Marland KitchenMarland Kitchen

## 2014-09-06 NOTE — Discharge Summary (Signed)
  Sharon Duran at [place] NAME: Sharon Duran    MR#:  [id number]  DATE OF BIRTH:  November 09, 1945  DATE OF ADMISSION:  08/17/2014 ADMITTING PHYSICIAN: Vaughan Basta, MD  DATE OF death: September 17, 2014 at 4:50 PM  PRIMARY CARE PHYSICIAN: JADALI,FAYEGH, MD    ADMISSION DIAGNOSIS:  Sepsis, due to unspecified organism [A41.9]  DISCHARGE DIAGNOSIS:  Principal Problem:   Fever Active Problems:   Cellulitis   Protein-calorie malnutrition, severe   Lung cancer   SECONDARY DIAGNOSIS:   Past Medical History  Diagnosis Date  . COPD (chronic obstructive pulmonary disease)   . Diabetes mellitus without complication   . Lung cancer     HOSPITAL COURSE:   1. Patient was admitted with fever and cellulitis of the lower extremities. Continuous fever. She was started on IV antibiotics. 2. 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 3. Acute encephalopathy. 4. Hypocalcemia 5. Type 2 diabetes without complication. 6. COPD  The patient was not doing well and was made comfort care by Dr. Dustin Flock on 09/01/2014. When I took over care on 09/02/2014 the patient was barely responsive. I started on morphine drip and continued to comfort care measures. The patient remained in this state until time of death on 09-18-2014 at 4:50 PM. The patient was kept comfortable with morphine drip and other comfort care measures. Family at the bedside every time I rounded.   CONSULTS OBTAINED:  Treatment Team:  Leia Alf, MD  DRUG ALLERGIES:   Allergies  Allergen Reactions  . Quinapril Hcl Nausea And Vomiting   Management plans discussed with the patient, family and they are in agreement.  CODE STATUS:  Advance Directive Documentation        Most Recent Value   Type of Advance Directive  Healthcare Power of Attorney   Pre-existing out of facility DNR order (yellow form or pink MOST form)     "MOST" Form in Place?       Loletha Grayer M.D on 18-Sep-2014 at 3:25 PM  Between 7am to 6pm - Pager - [phone]  After 6pm go to [email or web address] - password [email or web address] [facility]  Office  [phone]  CC: Primary care physician; [facility], MD

## 2014-09-11 NOTE — Progress Notes (Signed)
Patient ID: ORIAN AMBERG, female   DOB: 1945-06-02, 69 y.o.   MRN: [id number]  [facility] PROGRESS NOTE  PCP: [facility], MD  HPI/Subjective: As per family, she is about the same as yesterday. When stimulated or move she does open her eyes. I am unable to get any response out of her today.  ROS: Review of Systems  Unable to perform ROS  secondary to unresponsiveness  Exam: Physical Exam  HENT:  Head: Normocephalic.  Nose: No mucosal edema.  Eyes: Conjunctivae and lids are normal. Right pupil is not reactive. Left pupil is not reactive.  Neck: No JVD present. Carotid bruit is not present. No edema present. No thyroid mass and no thyromegaly present.  Cardiovascular: S1 normal and S2 normal.  Exam reveals no gallop.   Murmur heard.  Systolic murmur is present with a grade of 2/6  Pulses:      Dorsalis pedis pulses are 2+ on the right side, and 2+ on the left side.  Respiratory: Accessory muscle usage present. Apnea noted. She has decreased breath sounds in the right middle field, the right lower field, the left middle field and the left lower field. She has no wheezes. She has rhonchi in the right lower field and the left lower field. She has no rales.  GI: Soft. Bowel sounds are normal. There is no tenderness.  Musculoskeletal:       Right ankle: She exhibits swelling.       Left ankle: She exhibits swelling.  Lymphadenopathy:    She has no cervical adenopathy.  Neurological: She is unresponsive.  Skin: Skin is warm. Nails show no clubbing.  Bilateral lower extremity darkened discoloration and increased warmth.  Psychiatric:  Unresponsive to painful stimuli    Assessment/Plan:  1. 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 Continue comfort care measures and patient is a DO NOT RESUSCITATE. Continue morphine drip to 5 mg/h with when necessary IV morphine pushes and when necessary Ativan and glycopyrrolate. Scopolamine patch. Family to  discuss with each other on whether or not to increase the morphine drip to 7.5 mg/h. Patient has survived longer than expected. 2. Acute encephalopathy. 3. Fever, cellulitis bilateral lower extremities.  Antibiotics stopped. 4. 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 5. Hypocalcemia. 6. Type 2 diabetes without complication. 7. COPD  Code Status:     Code Status Orders        Start     Ordered   09/01/14 0900  Do not attempt resuscitation (DNR)   Continuous    Question Answer Comment  In the event of cardiac or respiratory ARREST Do not call a "code blue"   In the event of cardiac or respiratory ARREST Do not perform Intubation, CPR, defibrillation or ACLS   In the event of cardiac or respiratory ARREST Use medication by any route, position, wound care, and other measures to relive pain and suffering. May use oxygen, suction and manual treatment of airway obstruction as needed for comfort.      09/01/14 0900    Advance Directive Documentation        Most Recent Value   Type of Advance Directive  Healthcare Power of Attorney   Pre-existing out of facility DNR order (yellow form or pink MOST form)     "MOST" Form in Place?       Family Communication: Family at bedside.  Time spent: 20 minutes.  Loletha Grayer  [facility]

## 2014-09-11 NOTE — Progress Notes (Signed)
Patient passed away at 1650 w/ family at bedside. Verified w/ two RN's [place]. Dr. Leslye Peer & Nursing Supervisor aware.

## 2014-09-11 NOTE — Progress Notes (Signed)
Diet order still in computer and dietary still calling at each meal asking family for pt order. Family requested to have diet changed so they no longer call. Diet changed to NPO as pt has not ate in multiple days.

## 2014-09-11 NOTE — Progress Notes (Signed)
Family requesting oxygen be removed and Morphine drip increased. Dr. Leslye Peer had already spoke w/ family & aware of their wishes which he is in agreement with. Will remove oxygen & increased Morphine to 7.'5mg'$  per MD order. Will continue to assess.

## 2014-09-11 NOTE — Plan of Care (Signed)
Problem: Discharge Progression Outcomes Goal: Pain controlled with appropriate interventions Outcome: Progressing Pt remains on morphine drip for comfort.

## 2014-09-11 NOTE — Plan of Care (Signed)
Problem: Discharge Progression Outcomes Goal: Other Discharge Outcomes/Goals Plan of care progress to goal: 1.Patient remains mostly unresponsive, family at bedside. 2.Patient appears comfortable, morphine rate increased to 7.5 ml/hr.    3.PRN Ativan given x1 w/ relief for labored breathing/restlessness. Will continue to assess.

## 2014-09-11 DEATH — deceased

## 2014-09-12 ENCOUNTER — Inpatient Hospital Stay: Payer: Medicare HMO

## 2014-09-19 ENCOUNTER — Ambulatory Visit: Payer: Medicare HMO

## 2014-09-19 ENCOUNTER — Ambulatory Visit: Payer: Medicare HMO | Admitting: Internal Medicine

## 2014-09-19 ENCOUNTER — Other Ambulatory Visit: Payer: Medicare HMO

## 2015-02-07 ENCOUNTER — Other Ambulatory Visit: Payer: Self-pay | Admitting: Nurse Practitioner

## 2016-10-28 IMAGING — CT NM PET TUM IMG SKULL BASE T - THIGH
10 series · 24 of 25 positions shown · non-contrast
Comparison: Nuclear medicine bone scan dated 01/19/2014. CT chest
abdomen pelvis dated 01/19/2014.

CLINICAL DATA: Initial treatment strategy for lung cancer with bone
metastases.

EXAM:
NUCLEAR MEDICINE PET SKULL BASE TO THIGH
TECHNIQUE: 12.7 mCi F-18 FDG was injected intravenously. Full-ring PET imaging
was performed from the skull base to thigh after the radiotracer. CT
data was obtained and used for attenuation correction and anatomic
localization.
FASTING BLOOD GLUCOSE:  Value: 115 mg/dl

[Series 3: ct wb 5.0 b30f · axial · 5.0mm · 0.98mm/px · z∈[-1532,-665]mm · 3 of 290 slices shown]
[im 1/290]
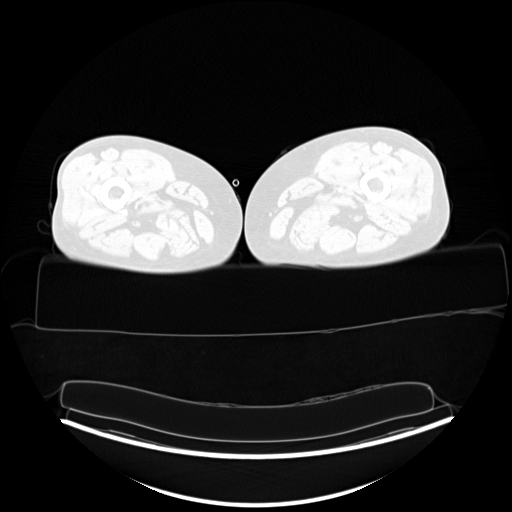
[im 145/290]
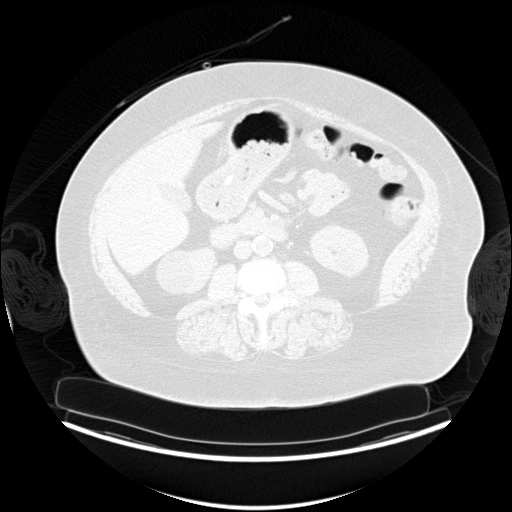
[im 290/290]
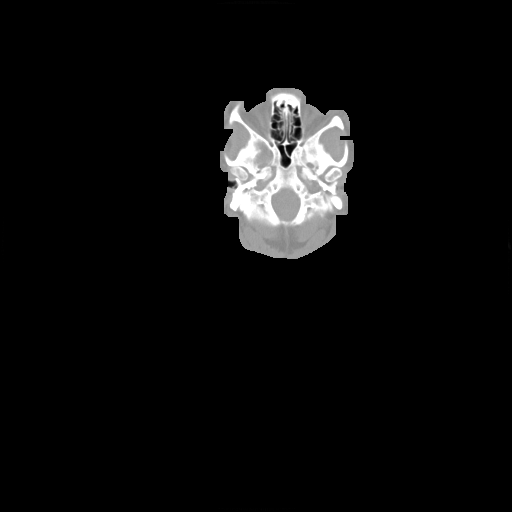

[Series 4: pet wb (ac) · axial · 5.0mm · 4.07mm/px · z∈[-1532,-665]mm · 3 of 290 slices shown]
[im 1/290]
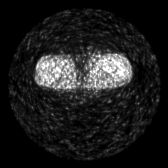
[im 145/290]
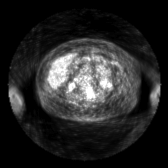
[im 290/290]
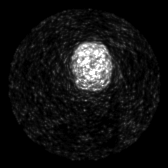

[Series 5: pet wb uncorrected (nac) · axial · 5.0mm · 4.07mm/px · z∈[-1532,-665]mm · 4 of 290 slices shown]
[im 1/290]
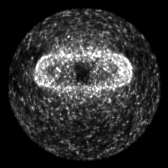
[im 97/290]
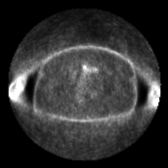
[im 193/290]
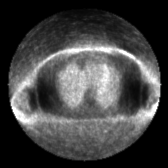
[im 290/290]
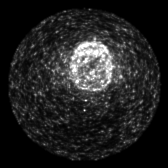

[Series 603: pet axial · 3 of 290 slices shown]
[im 1/290]
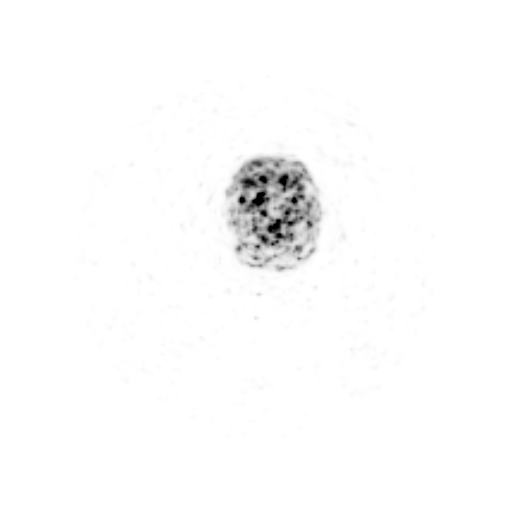
[im 97/290]
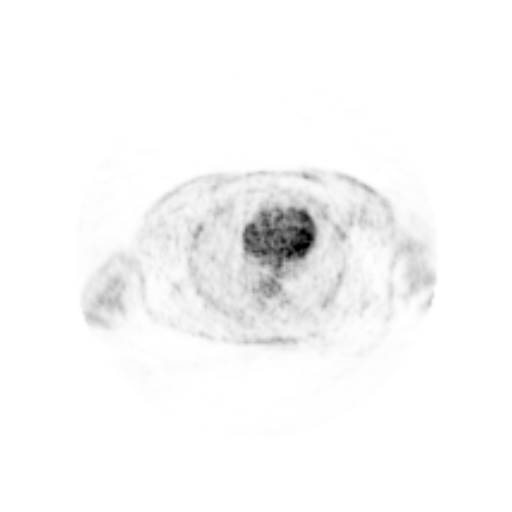
[im 290/290]
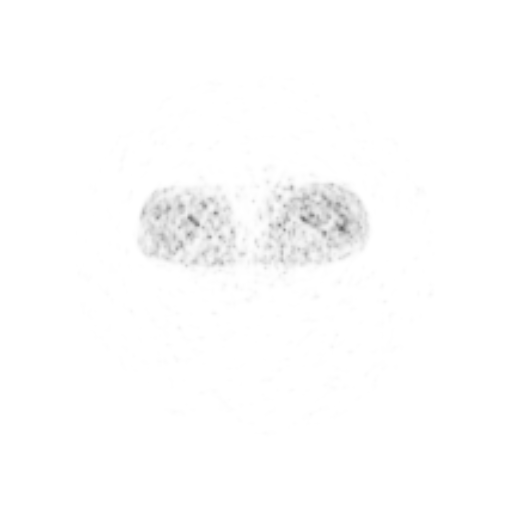

[Series 604: pet coronal · 1 of 114 slices shown]
[im 1/114]
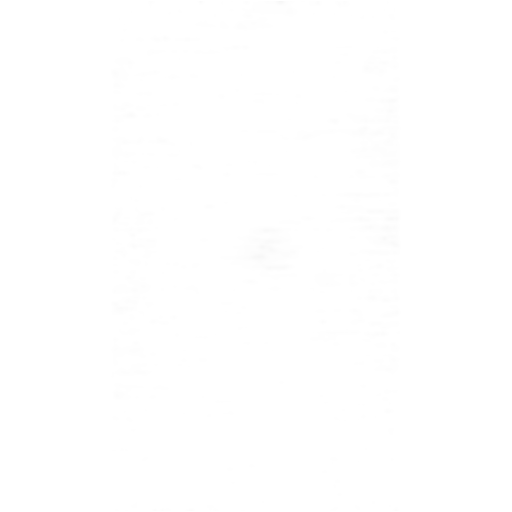

[Series 605: pet sagittal · 2 of 155 slices shown]
[im 1/155]
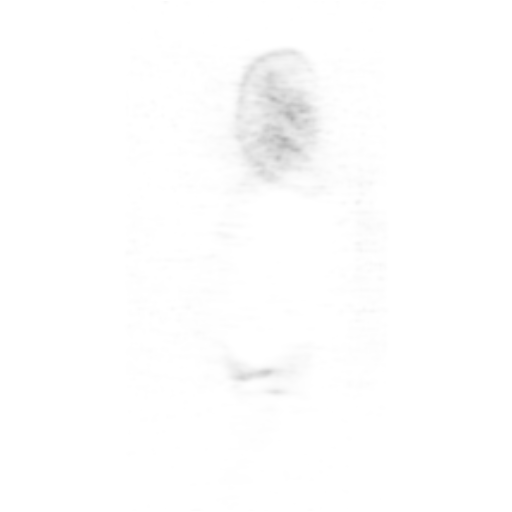
[im 155/155]
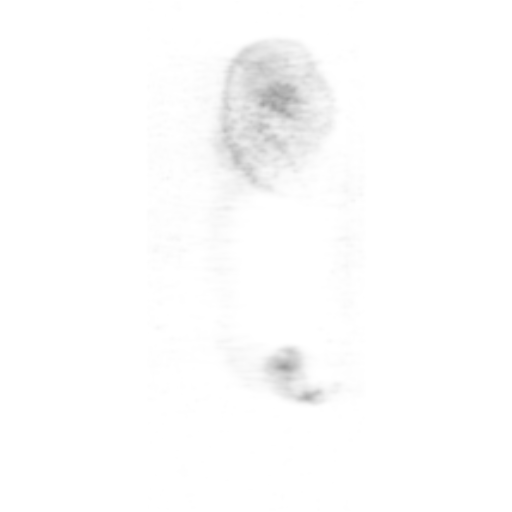

[Series 606: pet/ct axial · 4 of 289 slices shown]
[im 1/289]
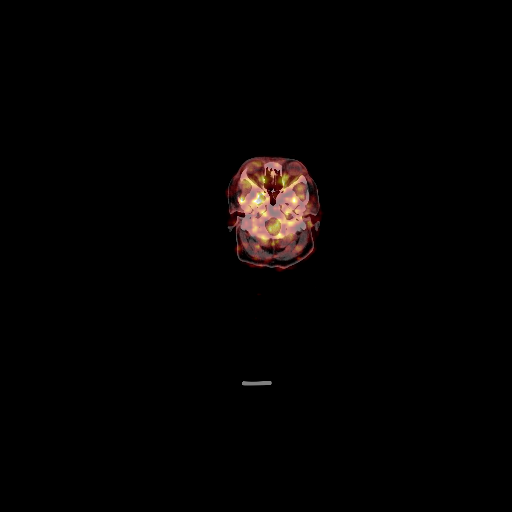
[im 97/289]
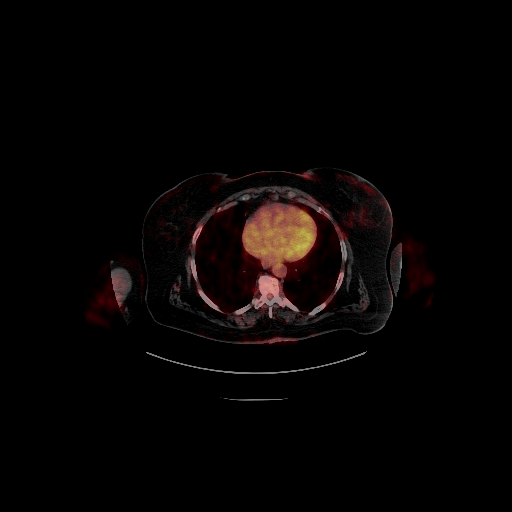
[im 193/289]
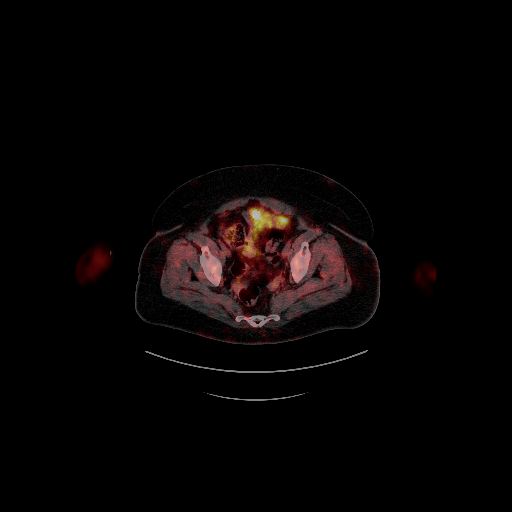
[im 289/289]
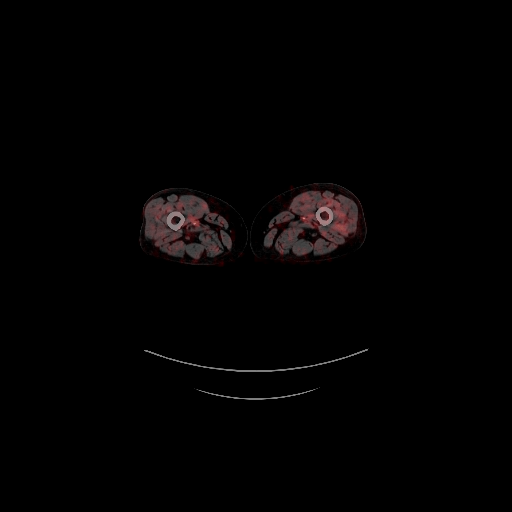

[Series 607: pet/ct coronal · 1 of 105 slices shown]
[im 1/105]
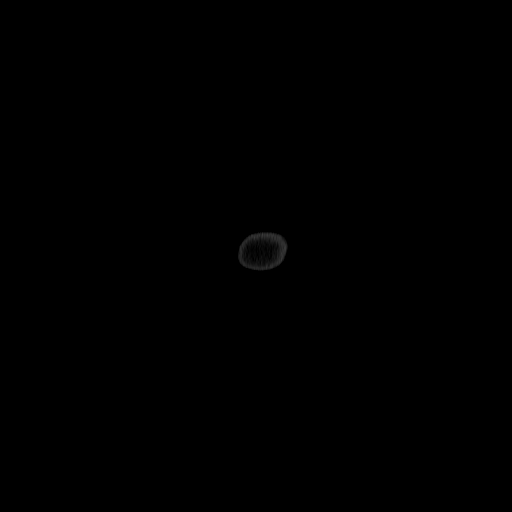

[Series 608: pet/ct sagittal · 2 of 134 slices shown]
[im 1/134]
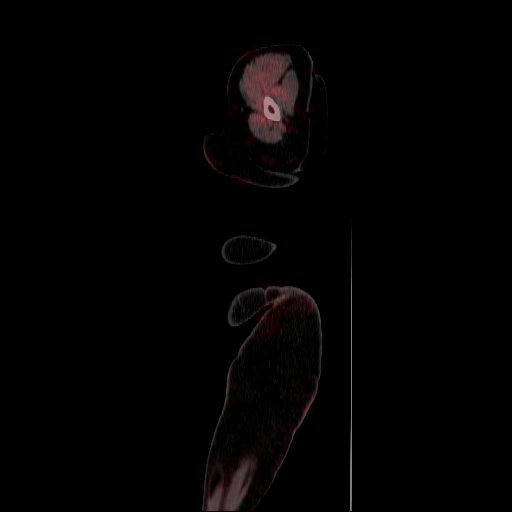
[im 134/134]
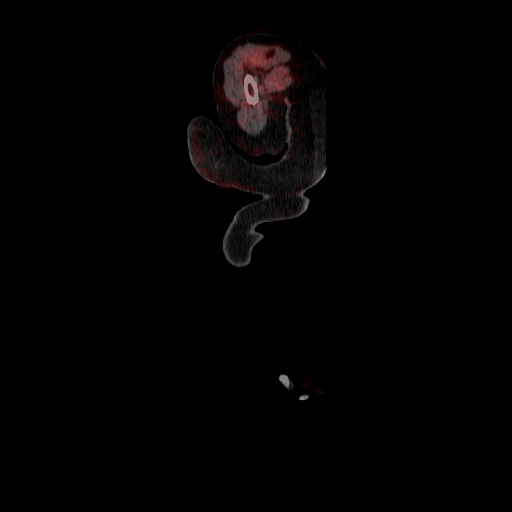

[Series 1038: results mm oncology reading · 1.00mm/px · 1 of 6 slices shown]
[im 1/6]
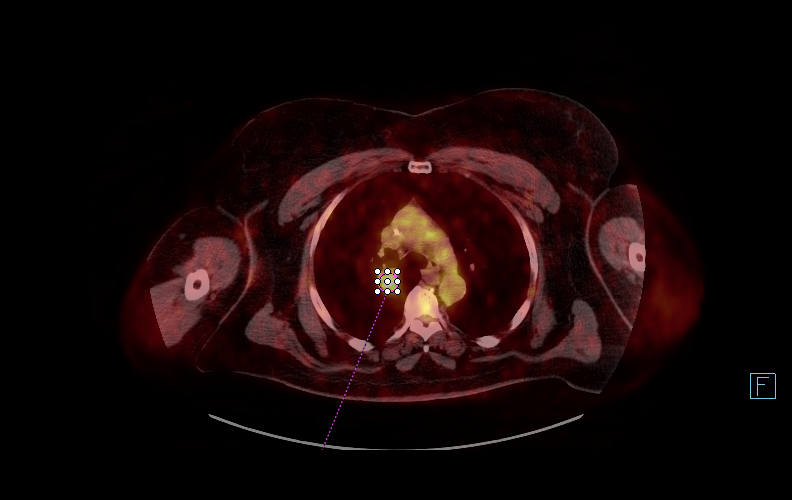

[24 of 25 positions shown; findings below may reference images not displayed]

FINDINGS: NECK

No hypermetabolic lymph nodes in the neck.

Enlarged/heterogeneous left thyroid gland, without associated
hypermetabolism, suggesting goiter.

CHEST

[DATE] x 2.6 cm macrolobulated nodule in the posteromedial right upper
lobe (series 3/image 57), max SUV 8.3, compatible with primary
bronchogenic neoplasm.

Additional 10 mm irregular nodule in the lateral right lower lobe
(series 3/ image 90), non FDG avid, although possibly below the size
threshold PET sensitivity.

Additional tiny bilateral pulmonary nodules (series 3/images 51, 55,
62, 75, 76, and 107).

Low-density middle mediastinal lesions are non FDG avid and measure
fluid density on prior CT, possibly reflecting foregut duplication
cysts. No hypermetabolic thoracic lymphadenopathy.

ABDOMEN/PELVIS

No abnormal hypermetabolic activity within the liver, pancreas, or
spleen.

Low-density nodularity of the left adrenal gland, compatible with a
benign adrenal adenoma. Right renal cyst.

No hypermetabolic lymph nodes in the abdomen or pelvis.

SKELETON

Multifocal osseous metastases, including:

--Destructive lytic lesion in the left L3 vertebral body (series 3/
image 143), max SUV

--Lytic lesion in the left iliac bone (series 3/ image 186), max SUV
5.6

--Lytic lesion in the left anterior acetabulum (series 3/image 197),
max SUV

--Expansile lytic lesion in the right greater trochanter (series 3/
image 209), max SUV

--Lytic lesion with anterior cortical disruption in the right
femoral neck (series 3/ image 222), max SUV
IMPRESSION: 2.6 cm nodule in the medial right upper lobe, compatible with
primary bronchogenic neoplasm.

Multifocal osseous metastases.

Mediastinal lesions measure fluid density and are non FDG avid,
possibly reflecting forget duplication cysts.

Enlarged left thyroid gland, non FDG avid, suggesting goiter.
